# Patient Record
Sex: Female | Born: 1975
Health system: Southern US, Community
[De-identification: ages and names within clinical notes are randomized; demographics above are authoritative.]

## PROBLEM LIST (undated history)

## (undated) DIAGNOSIS — D573 Sickle-cell trait: Secondary | ICD-10-CM

## (undated) DIAGNOSIS — Z8619 Personal history of other infectious and parasitic diseases: Secondary | ICD-10-CM

## (undated) DIAGNOSIS — R51 Headache: Secondary | ICD-10-CM

## (undated) DIAGNOSIS — D649 Anemia, unspecified: Secondary | ICD-10-CM

## (undated) DIAGNOSIS — O09529 Supervision of elderly multigravida, unspecified trimester: Secondary | ICD-10-CM

## (undated) DIAGNOSIS — T4145XA Adverse effect of unspecified anesthetic, initial encounter: Secondary | ICD-10-CM

## (undated) DIAGNOSIS — T8859XA Other complications of anesthesia, initial encounter: Secondary | ICD-10-CM

## (undated) DIAGNOSIS — O009 Unspecified ectopic pregnancy without intrauterine pregnancy: Secondary | ICD-10-CM

## (undated) DIAGNOSIS — F419 Anxiety disorder, unspecified: Secondary | ICD-10-CM

## (undated) HISTORY — PX: HERNIA REPAIR: SHX51

---

## 2001-02-26 DIAGNOSIS — O009 Unspecified ectopic pregnancy without intrauterine pregnancy: Secondary | ICD-10-CM

## 2001-02-26 HISTORY — DX: Unspecified ectopic pregnancy without intrauterine pregnancy: O00.90

## 2001-11-26 ENCOUNTER — Inpatient Hospital Stay (HOSPITAL_COMMUNITY): Admission: AD | Admit: 2001-11-26 | Discharge: 2001-11-29 | Payer: Self-pay | Admitting: Gynecology

## 2001-11-26 ENCOUNTER — Encounter (INDEPENDENT_AMBULATORY_CARE_PROVIDER_SITE_OTHER): Payer: Self-pay | Admitting: Specialist

## 2007-10-10 ENCOUNTER — Inpatient Hospital Stay (HOSPITAL_COMMUNITY): Admission: RE | Admit: 2007-10-10 | Discharge: 2007-10-13 | Payer: Self-pay | Admitting: Obstetrics and Gynecology

## 2009-11-14 ENCOUNTER — Emergency Department (HOSPITAL_COMMUNITY): Admission: EM | Admit: 2009-11-14 | Discharge: 2009-11-14 | Payer: Self-pay | Admitting: Emergency Medicine

## 2009-12-21 ENCOUNTER — Emergency Department (HOSPITAL_COMMUNITY)
Admission: EM | Admit: 2009-12-21 | Discharge: 2009-12-21 | Payer: Self-pay | Source: Home / Self Care | Admitting: Specialist

## 2010-01-26 ENCOUNTER — Ambulatory Visit (HOSPITAL_COMMUNITY)
Admission: RE | Admit: 2010-01-26 | Discharge: 2010-01-26 | Payer: Self-pay | Source: Home / Self Care | Admitting: Obstetrics and Gynecology

## 2010-02-26 HISTORY — PX: DILATION AND CURETTAGE OF UTERUS: SHX78

## 2010-05-08 LAB — CBC
HCT: 30.5 % — ABNORMAL LOW (ref 36.0–46.0)
Hemoglobin: 9.6 g/dL — ABNORMAL LOW (ref 12.0–15.0)
RBC: 4.99 MIL/uL (ref 3.87–5.11)
RDW: 20 % — ABNORMAL HIGH (ref 11.5–15.5)
WBC: 6.6 10*3/uL (ref 4.0–10.5)

## 2010-07-11 NOTE — Discharge Summary (Signed)
April [name], April [name]              ACCOUNT NO.:  [id number]   MEDICAL RECORD NO.:  [id number]          PATIENT TYPE:  INP   LOCATION:  9141                          FACILITY:  [facility]   PHYSICIAN:  Gerrit Friends. Aldona Bar, M.D.   DATE OF BIRTH:  February 18, 1976   DATE OF ADMISSION:  10/10/2007  DATE OF DISCHARGE:  10/13/2007                               DISCHARGE SUMMARY   DISCHARGE DIAGNOSES:  1. Term pregnancy delivered 7-pound and 9-ounce female infant, Apgars of      8 and 9.  2. Blood type AB positive.  3. Previous cesarean section x2.  4. Omental adhesions.  5. Postoperative anemia.   PROCEDURE:  A repeat low-transverse cesarean section and lysis of  omental adhesions.   SUMMARY:  This patient, a 35 year old having had 2 previous cesarean  sections was admitted for a repeat cesarean section at term.  She was  taken to the operating room by Dr. Dareen Piano on October 10, 2007, and was  delivered the 7-pound and 9-ounce female infant with a good Apgars.  Intraoperatively, there were some lysis of adhesions involving the  omentum.  Postoperatively, the patient's vital signs remained stable.  Her postop hemoglobin was 6.6 with a white count of 8000 and a platelet  count of 118,000.  Her preop hemoglobin was 10.2 with a white count of  6800 and platelet count of 247,000.   The patient remained afebrile during her hospital course.  On the  morning of October 13, 2007, her wound was slightly moist and Steri-  Strips were holding well.  There were no obvious collections and her  uterus was firm.  Decision was made to discharge the patient as her  vital signs were stable and she was doing well, not only postoperatively  but with her breast-feeding.  Her old Steri-Strips were removed.  The  wound was cleaned and new Steri-Strips were applied, and appropriate  instructions were given to the patient regarding incisional care.  She  was also given an instruction brochure and understood all instructions   well.   DISCHARGE MEDICATIONS:  Tylox 1-2 every 4-6 hours as needed for severe  pain, Motrin 600 mg every 6 hours as needed for cramping or pain and she  will stay on her vitamins, and she will begin Feosol capsules - one  daily.  She will return to the office for followup in approximately 4  weeks' time or as needed.   CONDITION ON DISCHARGE:  Improved.      Gerrit Friends. Aldona Bar, M.D.  Electronically Signed     RMW/MEDQ  D:  10/13/2007  T:  10/13/2007  Job:  [id number]

## 2010-07-11 NOTE — Op Note (Signed)
[name], April [name]              ACCOUNT NO.:  [id number]   MEDICAL RECORD NO.:  [id number]          PATIENT TYPE:  INP   LOCATION:  9199                          [facility]ACILITY:  [facility]   PHYSICIAN:  Malva Limes, M.D.    DATE OF BIRTH:  1975-03-11   DATE OF PROCEDURE:  10/10/2007  DATE OF DISCHARGE:                               OPERATIVE REPORT   PREOPERATIVE DIAGNOSES:  1. Intrauterine pregnancy at term.  2. History of prior cesarean section x2.  3. Patient desires repeat cesarean section.   POSTOPERATIVE DIAGNOSES:  1. Intrauterine pregnancy at term.  2. History of prior cesarean section x2.  3. Patient desires repeat cesarean section.   PROCEDURES:  1. Repeat low transverse cesarean section.  2. Lysis of omental adhesion.   SURGEON:  Malva Limes, MD   ASSISTANT:  Luvenia Redden, MD   ANESTHESIA:  Spinal plus local antibiotic Ancef 1 g.   DRAINS:  Foley to bedside drainage.   ESTIMATED BLOOD LOSS:  900 mL.   COMPLICATIONS:  None.   SPECIMENS:  None.   PROCEDURE:  The patient was taken to the operative suite where spinal  anesthetic was administered.  Once an adequate level was reached, the  patient was prepped and draped in the usual fashion for this procedure.  A Pfannenstiel incision was made through the previous scar.  On entering  parietal peritoneum, it was noted that the patient had an omental  adhesion to the left lower abdominal wall.  This was taken down with the  Bovie.  The bladder flap was not taken down.  A low transverse uterine  incision was made in the midline and extended laterally with blunt  dissection.  The amniotic sac was entered with the Metzenbaums.  The  fluid was noted be clear.  The infant was delivered with the vacuum  extractor in the vertex presentation.  There was a nuchal cord x1, which  was reduced.  On delivery of the head, the oropharynx and nostrils were  bulb suctioned.  The remaining infant was then delivered.  The cord was  doubly clamped and cut, and the infant handed to the waiting NICU team.  The placenta was manually removed.  The uterus was examined and cleaned  with the wet lap.  The patient was noted to have a 7-cm anterior fibroid  in the midportion of the fundus.  The uterine incision was closed in a  single layer of 0 Monocryl suture in a running locking fashion.  Hemostasis was checked and felt to be adequate.  The parietal peritoneum  and rectus muscles were reapproximated in the midline using 0 Monocryl  suture in a running fashion.  The fascia was closed using 0 Monocryl  suture in a running fashion.  The subcuticular tissue was made  hemostatic with the Bovie.  The subcuticular space was closed with  interrupted 2-0 plain gut suture.  The skin was closed with 3-0 Rapide  in a subcuticular fashion.  Steri-Strips were then placed.  The patient  was taken to recovery room in stable condition.  Instrument and lap  counts  were correct x2.           ______________________________  Malva Limes, M.D.     MA/MEDQ  D:  10/10/2007  T:  10/11/2007  Job:  [id number]

## 2010-07-14 NOTE — Discharge Summary (Signed)
   NAME:  April [name], April [name]                        ACCOUNT NO.:  [id number]   MEDICAL RECORD NO.:  [id number]                   PATIENT TYPE:  INP   LOCATION:  9324                                 [facility]ILITY:  [facility]   PHYSICIAN:  Juan H. Lily Peer, M.D.             DATE OF BIRTH:  15-Mar-1975   DATE OF ADMISSION:  11/26/2001  DATE OF DISCHARGE:  11/29/2001                                 DISCHARGE SUMMARY   DISCHARGE DIAGNOSIS:  Left isthmic ectopic pregnancy nonruptured.   PROCEDURE:  1. Attempted diagnostic laparoscopy.  2. Laparotomy.  3. Left Lanier salpingoscopy.   HISTORY OF PRESENT ILLNESS:  The patient is a 34 year old gravida 3, para 2  currently at 7-1/[redacted] weeks gestation.  The patient presents with left lower  quadrant pain, vaginal bleeding, a quantitive beta HCG 3000 milliunits  range.  Ultrasound reveals no evidence of intrauterine pregnancy.   PAST MEDICAL HISTORY:  The patient has had two previous cesarean sections,  umbilical herniorrhaphy.  She denies allergies, alcohol consumption or  smoking.  No history of pelvic inflammatory disease.  The patient was  admitted for definitive therapy.   HOSPITAL COURSE:  The patient was admitted for attempted diagnostic  laparoscopy, laparotomy, left Lanier salpingoscopy performed by Dr.  Lily Peer under general anesthesia.  Findings revealed a left isthmic  unruptured ectopic pregnancy, rush fimbriated end, normal left ovarian,  right tubes and ovary, normal uterus.  The cul-de-sac was without evidence  of endometriosis or adhesions.  Anterior cul-de-sac clear.  Upper abdominal  adhesions postoperatively.  The patient initially had some problems with  nausea and tolerating clear liquids although she did not have vomiting.  This did spontaneously resolved.  She was able to be discharged in  satisfactory condition on her third postoperative day.   CBC, hematocrit 29.7.  Hemoglobin 9.8.  Platelets 239.   DISPOSITION:  The patient  was discharged on the appropriate pain medication  and was to follow up in the office on 12/02/01 for staple removal.     Elwyn Lade . Hancock, N.P.                Gaetano Hawthorne. Lily Peer, M.D.    MKH/MEDQ  D:  12/22/2001  T:  12/22/2001  Job:  [id number]

## 2010-07-14 NOTE — Op Note (Signed)
NAME:  April [name], April [name]                        ACCOUNT NO.:  [id number]   MEDICAL RECORD NO.:  [id number]                   PATIENT TYPE:  AMB   LOCATION:  SDC                                  [facility]   PHYSICIAN:  Juan H. Lily Peer, M.D.             DATE OF BIRTH:  02-Sep-1975   DATE OF PROCEDURE:  11/26/2001  DATE OF DISCHARGE:                                 OPERATIVE REPORT   INDICATION FOR OPERATION:  The patient is a 35 year old gravida 3, para 2,  currently 7 1/2 weeks' gestation with left lower quadrant pain, vaginal  bleeding, and a quantitative beta HCG at the 3000 mIU range with ultrasound  with no evidence of intrauterine pregnancy, suspicious left adnexal region  for a left ectopic pregnancy.   PREOPERATIVE DIAGNOSIS:  Left ectopic pregnancy.   POSTOPERATIVE DIAGNOSIS:  Left isthmic ectopic pregnancy, nonruptured.   ANESTHESIA:  General endotracheal anesthesia.   SURGEON:  Juan H. Lily Peer, M.D.   PROCEDURES PERFORMED:  1. Attempted diagnostic laparoscopy.  2. Laparotomy.  3. Left linear salpingostomy.   FINDINGS:  Left isthmic ectopic pregnancy, nonruptured.  Lush fimbriated  end, normal left ovary, normal right tube and ovary, normal uterus.  Cul-de-  sac was without evidence of endometriosis or adhesions, anterior cul-de-sac  clear, upper abdominal adhesions.   DESCRIPTION OF OPERATION:  After the patient was adequately counseled, she  was taken to the operating room where she underwent a successful general  endotracheal anesthesia.  She was placed in low lithotomy position.  The  abdomen, vagina, and perineum were prepped and draped in the usual sterile  fashion.  A Foley catheter was inserted to monitor urinary output.  A Hulka  tenaculum was introduced for manipulation during laparoscopic procedure.  After the drapes had been placed a small subumbilical incision was made  adjacent to the previous subumbilical incision site from where previous  umbilical hernia repair had been done.  The incision was carried down  through the skin, subcutaneous tissue, down to the rectus fascia.  A blunt  dissection was attempted with the use of the Kocher and tenting the fascia  and introducing the 10-mm trocar and after entrance into the peritoneal  cavity was accomplished, the sleeve was left in place.  The trocar was  removed.  The laparoscope was inserted.  It was noted at this time that  extensive anterior abdominal wall adhesions were noted and a small 5-mm  trocar was inserted above the symphysis pubis four fingerbreadths in midline  under laparoscopic guidance.  It was difficult after identifying the left  isthmic ectopic pregnancy to manipulate the area to safely proceed with the  laparoscopic removal of the ectopic pregnancy and so it was decided to abort  and proceed then the safer route, which would have been a laparotomy.  The  laparoscope was removed as was the sleeve.  The fascia was closed in a  pursestring  fashion with a GR6 needle with 0 Vicryl suture.  The skin was  reapproximated with skin clips.  The patient's leg was then straightened  after the table was then brought up.  She was no longer in the supine  position and the Hulka tenaculum was removed.  Sterile drapes were placed on  lower extremities.  She was to receive 2 grams of Cefotan.  The incision was  extended from the 5-mm trocar site in a transverse fashion.  The  subcutaneous tissue and the fascia was reincised and the fascia was incised  in a transverse fashion.  The peritoneal cavity was entered.  The Lenox Ahr retractor was placed and the peritoneal cavity was entered.  The  left side isthmic ectopic pregnancy was identified.  Pitressin 1 unit and 20  cc of saline was infiltrated to the serosa of the fallopian tube and with a  micro Bovie tip, a linear salpingostomy was made on the outside mesenteric  portion of the fallopian tube, an ectopic  pregnancy on left.  Small bleeders  were cauterized and pelvic cavity was copiously irrigated with normal saline  solution.  Interceed was placed over the salpingostomy site.  The needle  count, sponge count was correct.  The O'Connor-O'Sullivan retractor was  removed.  The visceral peritoneum was now reapproximated and the rectus  fascia was closed in a running stitch fashion.  Subcutaneous bleeders were  Bovied cauterized.  The skin was reapproximated with skin clips followed by  placing Xeroform gauze for dressing.  The patient was extubated and  transferred to recovery with stable vital signs.  Her blood loss was 100 cc,  IV fluids 2000 cc of lactated Ringers'.  Blood type was AB-positive.  Urine  output 350 cc and clear.  She was to receive 2 grams of IV Cefotan  prophylactically.                                                 Juan H. Lily Peer, M.D.    JHF/MEDQ  D:  11/26/2001  T:  11/27/2001  Job:  [id number]

## 2010-07-14 NOTE — H&P (Signed)
NAME:  April [name], April [name]                        ACCOUNT NO.:  [id number]   MEDICAL RECORD NO.:  [id number]                   PATIENT TYPE:  AMB   LOCATION:  SDC                                  [facility]   PHYSICIAN:  Juan H. Lily Peer, M.D.             DATE OF BIRTH:  [birth year]   DATE OF ADMISSION:  11/26/2001  DATE OF DISCHARGE:                                HISTORY & PHYSICAL   CHIEF COMPLAINT:  Vaginal bleeding and left lower quadrant pain.   HISTORY OF PRESENT ILLNESS:  The patient is a 35 year old gravida 3, para 2,  who was seen in the office at [facility] on November 20, 2001  due to the fact that she had a positive pregnancy test.  She had stated that  she had been bleeding for 10th day prior to that with clots and was changing  two to three times a day.  There was no vaginal bleeding noted on the day of  the examination November 20, 2001 and cramping was significant but had  resolved at the time of this visit.  She had a history of anxiety and panic  attacks in the past.  She has had two previous cesarean sections, one for  failure to progress, second for significant amount of pain and anxiety and  therefore was a repeat.  She was evaluated and it was found that she had  bacterial vaginosis, started on Cleocin q.h.s. for six nights.  Her blood  type was found to be AB positive.  She had a quantitative beta hCG.  On  November 20, 2001 it 815.  It was retested again on November 24, 2001 and  it rose to 2808.  She had an ultrasound done on November 20, 2001 and it  was assumed that perhaps she may have had a complete spontaneous AB.  There  was no evidence of intrauterine or extrauterine pregnancy.  There was a  questionable 13 x 10 mm complex thin wall cyst, probably hemorrhagic or  involuting.  She was asked to return and to follow up with a quantitative  beta hCG.  Her ultrasound today due to the fact that she was still having  left lower quadrant  pain demonstrated a uterus measuring 9.7 x 5.8 cm,  endometrial stripe 17.9 mm trilayered, no intrauterine pregnancy was seen.  The right ovary was normal.  The left ovary measured 2.9 x 1.9 x 2.5 cm with  hypoechoic focus measuring 13 x 11 mm located above the ovary and walled off  the uterus, on the left was a smooth round mass with a central cystic area  measuring 15 x 11 x 13 mm with increased vascular flow at the wall of the  mass - could not exclude an ectopic pregnancy.  She was asked to come to  [facility] and to have a quantitative beta hCG to see what the trend  was.  It  looked like it had been slightly leveling off with a value of only  3230 and not an appropriate rise.  On exam in the office as well as in the  emergency room it was evident that she had a tender left lower quadrant pain  with some rebound.  With these findings, she will be undergoing a  laparoscopy with possible left salpingostomy, possible salpingectomy,  possible left salpingo-oophorectomy this evening.   PAST MEDICAL HISTORY:  The patient with two previous cesarean sections.  She  has had an umbilical herniorrhaphy in the past.  She denies any allergies.  She denies alcohol consumption or smoking.  No history of PID reported in  the past.   LABORATORY DATA:  Quantitative beta hCG as described above.  Blood type AB  positive.  Hemoglobin and hematocrit were 10.8 and 33.7 with a platelet  count of 266,000.   PHYSICAL EXAMINATION:  VITAL SIGNS: Temperature was 100.4 when she came in,  pulse at 83 and 79, respectively; respirations 18, blood pressure initially  was 100/56 then 95/64 and her pain intensity has increased and she was  waiting for the results of the quantitative beta hCG and radiating into her  back and was having some spotting.  HEENT: Unremarkable.  NECK: Supple, trachea midline; no carotid bruits, no thyromegaly.  LUNGS: Clear to auscultation without rhonchi or wheezes.  HEART: Regular  rate and rhythm; no murmurs or gallops.  BREAST: Not done.  ABDOMEN: Soft, tender left lower quadrant with mild rebound.  PELVIC: Not done due to the fact that we had a recent ultrasound with the  above-mentioned findings and due to the fact the patient had significant  amount of discomfort.   ASSESSMENT:  The patient is a 35 year old gravida 3, para 2 with suspicion  for a left ectopic pregnancy with minimal rise of quantitative beta hCG with  values of 3230 today with no evidence of intrauterine pregnancy on  ultrasound with suspicious left adnexal mass for an ectopic pregnancy.  The  patient's blood type is AB positive.  The patient was counseled that she  will undergo a diagnostic laparoscopy to rule out an ectopic pregnancy based  on her dates, also by the way, she would be 33 weeks and 5 days.  We  discussed in detail the risks, benefits, pros and cons of the operation to  include infection although she will receive prophylactic antibiotics,  bleeding to internal organs which may require open laparotomy and to gain  access to the abdominal cavity for corrective surgery and repair,  also the  potential risk in the event that this is a corneal ectopic pregnancy that we  may not be able to proceed with the operation laparoscopically and she may  need an open laparotomy, also the potential risk for salpingostomy,  salpingectomy and left salpingo-oophorectomy and last resort as a lifesaving  measure a total abdominal hysterectomy and the patient will no longer be  able to have children, she is fully aware of this, also the risk of  hemorrhage and in the event of blood transfusion there is risk for  anaphylactic reaction, hepatitis and AIDS, also the risk for deep venous  thrombosis and pulmonary embolism was discussed with the patient.  All these  issues were discussed and as well as potential trauma from laparoscopic surgery.  Hospitalization stay can be from 1 to 2 to 3 days depending  on the  extent and what is found at the time of her surgery.  All  their questions  were answered.  Husband was present and questions were answered and we will  follow accordingly.   PLAN:  The patient scheduled for laparoscopy, possible left salpingostomy,  possible left salpingectomy and possibly left-sided oophorectomy, possible  laparotomy this evening.  Plan as per assessment above.                                               Juan H. Lily Peer, M.D.    JHF/MEDQ  D:  11/26/2001  T:  11/26/2001  Job:  [id number]

## 2010-11-24 LAB — CBC
Hemoglobin: 10.2 — ABNORMAL LOW
MCHC: 31.9
RBC: 4.72

## 2011-01-23 ENCOUNTER — Emergency Department (HOSPITAL_COMMUNITY)
Admission: EM | Admit: 2011-01-23 | Discharge: 2011-01-23 | Disposition: A | Discharge status: Home / Self Care | Payer: No Typology Code available for payment source | Attending: Emergency Medicine | Admitting: Emergency Medicine

## 2011-01-23 ENCOUNTER — Encounter: Payer: Self-pay | Admitting: Emergency Medicine

## 2011-01-23 ENCOUNTER — Emergency Department (HOSPITAL_COMMUNITY): Payer: No Typology Code available for payment source

## 2011-01-23 DIAGNOSIS — Y92410 Unspecified street and highway as the place of occurrence of the external cause: Secondary | ICD-10-CM | POA: Insufficient documentation

## 2011-01-23 DIAGNOSIS — M542 Cervicalgia: Secondary | ICD-10-CM | POA: Insufficient documentation

## 2011-01-23 DIAGNOSIS — S161XXA Strain of muscle, fascia and tendon at neck level, initial encounter: Secondary | ICD-10-CM

## 2011-01-23 DIAGNOSIS — M545 Low back pain, unspecified: Secondary | ICD-10-CM | POA: Insufficient documentation

## 2011-01-23 DIAGNOSIS — S139XXA Sprain of joints and ligaments of unspecified parts of neck, initial encounter: Secondary | ICD-10-CM | POA: Insufficient documentation

## 2011-01-23 DIAGNOSIS — S335XXA Sprain of ligaments of lumbar spine, initial encounter: Secondary | ICD-10-CM | POA: Insufficient documentation

## 2011-01-23 DIAGNOSIS — S39012A Strain of muscle, fascia and tendon of lower back, initial encounter: Secondary | ICD-10-CM

## 2011-01-23 MED ORDER — IBUPROFEN 800 MG PO TABS: 800.0000 mg | ORAL_TABLET | Freq: Three times a day (TID) | ORAL | Status: AC

## 2011-01-23 MED ORDER — HYDROCODONE-ACETAMINOPHEN 5-500 MG PO TABS: 1.0000 | ORAL_TABLET | Freq: Four times a day (QID) | ORAL | Status: AC | PRN

## 2011-01-23 MED ORDER — DIAZEPAM 5 MG PO TABS
5.0000 mg | ORAL_TABLET | Freq: Once | ORAL | Status: AC
  Administered 2011-01-23: 5 mg via ORAL
  Filled 2011-01-23: qty 1

## 2011-01-23 MED ORDER — FENTANYL CITRATE 0.05 MG/ML IJ SOLN
100.0000 ug | Freq: Once | INTRAMUSCULAR | Status: AC
  Administered 2011-01-23: 100 ug via INTRAMUSCULAR
  Filled 2011-01-23: qty 2

## 2011-01-23 MED ORDER — DIAZEPAM 5 MG PO TABS: ORAL_TABLET | ORAL | Status: AC

## 2011-01-23 MED ORDER — ONDANSETRON 8 MG PO TBDP
8.0000 mg | ORAL_TABLET | Freq: Once | ORAL | Status: AC
Start: 2011-01-23 — End: 2011-01-23
  Administered 2011-01-23: 8 mg via ORAL
  Filled 2011-01-23: qty 1

## 2011-01-23 MED ORDER — OXYCODONE-ACETAMINOPHEN 5-325 MG PO TABS: 1.0000 | ORAL_TABLET | Freq: Once | ORAL | Status: DC

## 2011-01-23 NOTE — ED Notes (Signed)
Family at bedside. 
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
## 2011-01-23 NOTE — ED Notes (Signed)
MD at bedside. 

## 2011-01-23 NOTE — ED Provider Notes (Signed)
Medical screening examination/treatment/procedure(s) were performed by non-physician practitioner and as supervising physician I was immediately available for consultation/collaboration.   Nickalous Stingley, MD 01/23/11 1616 

## 2011-01-23 NOTE — ED Notes (Signed)
C-collar removed by PA. Pt. Medicated and ready for d/c.
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
## 2012-09-04 ENCOUNTER — Other Ambulatory Visit: Payer: Self-pay | Admitting: Neurosurgery

## 2012-09-05 ENCOUNTER — Encounter (HOSPITAL_COMMUNITY): Payer: Self-pay | Admitting: *Deleted

## 2012-09-07 MED ORDER — CEFAZOLIN SODIUM-DEXTROSE 2-3 GM-% IV SOLR
2.0000 g | INTRAVENOUS | Status: AC
  Administered 2012-09-08: 2 g via INTRAVENOUS

## 2012-09-08 ENCOUNTER — Encounter (HOSPITAL_COMMUNITY): Payer: Self-pay | Admitting: *Deleted

## 2012-09-08 ENCOUNTER — Ambulatory Visit (HOSPITAL_COMMUNITY): Payer: 59

## 2012-09-08 ENCOUNTER — Observation Stay (HOSPITAL_COMMUNITY)
Admission: RE | Admit: 2012-09-08 | Discharge: 2012-09-09 | Disposition: A | Discharge status: Home / Self Care | Payer: 59 | Source: Ambulatory Visit | Attending: Neurosurgery | Admitting: Neurosurgery

## 2012-09-08 ENCOUNTER — Encounter (HOSPITAL_COMMUNITY)
Admission: RE | Disposition: A | Discharge status: Home / Self Care | Payer: Self-pay | Source: Ambulatory Visit | Attending: Neurosurgery

## 2012-09-08 ENCOUNTER — Ambulatory Visit (HOSPITAL_COMMUNITY): Payer: 59 | Admitting: *Deleted

## 2012-09-08 ENCOUNTER — Encounter (HOSPITAL_COMMUNITY): Payer: Self-pay

## 2012-09-08 DIAGNOSIS — M5020 Other cervical disc displacement, unspecified cervical region: Principal | ICD-10-CM | POA: Diagnosis present

## 2012-09-08 HISTORY — DX: Anxiety disorder, unspecified: F41.9

## 2012-09-08 HISTORY — PX: ANTERIOR CERVICAL DECOMP/DISCECTOMY FUSION: SHX1161

## 2012-09-08 HISTORY — DX: Headache: R51

## 2012-09-08 HISTORY — DX: Anemia, unspecified: D64.9

## 2012-09-08 HISTORY — DX: Adverse effect of unspecified anesthetic, initial encounter: T41.45XA

## 2012-09-08 HISTORY — DX: Other complications of anesthesia, initial encounter: T88.59XA

## 2012-09-08 HISTORY — DX: Unspecified ectopic pregnancy without intrauterine pregnancy: O00.90

## 2012-09-08 LAB — CBC
MCH: 20.4 pg — ABNORMAL LOW (ref 26.0–34.0)
MCHC: 32.9 g/dL (ref 30.0–36.0)
Platelets: 311 10*3/uL (ref 150–400)
RDW: 16.6 % — ABNORMAL HIGH (ref 11.5–15.5)

## 2012-09-08 LAB — HCG, SERUM, QUALITATIVE: Preg, Serum: NEGATIVE

## 2012-09-08 LAB — SURGICAL PCR SCREEN
MRSA, PCR: NEGATIVE
Staphylococcus aureus: POSITIVE — AB

## 2012-09-08 SURGERY — ANTERIOR CERVICAL DECOMPRESSION/DISCECTOMY FUSION 1 LEVEL
Anesthesia: General | Site: Neck | Wound class: Clean

## 2012-09-08 MED ORDER — MUPIROCIN 2 % EX OINT
TOPICAL_OINTMENT | Freq: Two times a day (BID) | CUTANEOUS | Status: DC
  Filled 2012-09-08: qty 22

## 2012-09-08 MED ORDER — HYDROMORPHONE HCL PF 1 MG/ML IJ SOLN
INTRAMUSCULAR | Status: AC
  Filled 2012-09-08: qty 1

## 2012-09-08 MED ORDER — SENNA 8.6 MG PO TABS
1.0000 | ORAL_TABLET | Freq: Two times a day (BID) | ORAL | Status: DC
Start: 2012-09-08 — End: 2012-09-09
  Administered 2012-09-08 – 2012-09-09 (×2): 8.6 mg via ORAL
  Filled 2012-09-08 (×3): qty 1

## 2012-09-08 MED ORDER — MIDAZOLAM HCL 5 MG/5ML IJ SOLN
INTRAMUSCULAR | Status: DC | PRN
  Administered 2012-09-08: 1 mg via INTRAVENOUS

## 2012-09-08 MED ORDER — PHENOL 1.4 % MT LIQD: 1.0000 | OROMUCOSAL | Status: DC | PRN

## 2012-09-08 MED ORDER — OXYCODONE-ACETAMINOPHEN 5-325 MG PO TABS
1.0000 | ORAL_TABLET | ORAL | Status: DC | PRN
  Administered 2012-09-09 (×2): 2 via ORAL
  Filled 2012-09-08 (×3): qty 2

## 2012-09-08 MED ORDER — HYDROMORPHONE HCL PF 1 MG/ML IJ SOLN
0.5000 mg | INTRAMUSCULAR | Status: DC | PRN
  Administered 2012-09-08 (×2): 1 mg via INTRAVENOUS
  Filled 2012-09-08 (×2): qty 1

## 2012-09-08 MED ORDER — OXYCODONE HCL 5 MG PO TABS
ORAL_TABLET | ORAL | Status: AC
  Filled 2012-09-08: qty 1

## 2012-09-08 MED ORDER — ACETAMINOPHEN 325 MG PO TABS: 650.0000 mg | ORAL_TABLET | ORAL | Status: DC | PRN

## 2012-09-08 MED ORDER — ARTIFICIAL TEARS OP OINT
TOPICAL_OINTMENT | OPHTHALMIC | Status: DC | PRN
  Administered 2012-09-08: 1 via OPHTHALMIC

## 2012-09-08 MED ORDER — GLYCOPYRROLATE 0.2 MG/ML IJ SOLN
INTRAMUSCULAR | Status: DC | PRN
  Administered 2012-09-08: .8 mg via INTRAVENOUS

## 2012-09-08 MED ORDER — THROMBIN 5000 UNITS EX SOLR
CUTANEOUS | Status: DC | PRN
  Administered 2012-09-08 (×2): 5000 [IU] via TOPICAL

## 2012-09-08 MED ORDER — SODIUM CHLORIDE 0.9 % IJ SOLN: 3.0000 mL | INTRAMUSCULAR | Status: DC | PRN

## 2012-09-08 MED ORDER — SODIUM CHLORIDE 0.9 % IR SOLN
Status: DC | PRN
  Administered 2012-09-08: 14:00:00

## 2012-09-08 MED ORDER — CYCLOBENZAPRINE HCL 10 MG PO TABS
10.0000 mg | ORAL_TABLET | Freq: Three times a day (TID) | ORAL | Status: DC | PRN
  Administered 2012-09-08 – 2012-09-09 (×2): 10 mg via ORAL
  Filled 2012-09-08 (×2): qty 1

## 2012-09-08 MED ORDER — FENTANYL CITRATE 0.05 MG/ML IJ SOLN
INTRAMUSCULAR | Status: DC | PRN
  Administered 2012-09-08: 100 ug via INTRAVENOUS
  Administered 2012-09-08 (×5): 50 ug via INTRAVENOUS

## 2012-09-08 MED ORDER — PROPOFOL 10 MG/ML IV BOLUS
INTRAVENOUS | Status: DC | PRN
  Administered 2012-09-08: 280 mg via INTRAVENOUS

## 2012-09-08 MED ORDER — PROMETHAZINE HCL 25 MG/ML IJ SOLN: 6.2500 mg | INTRAMUSCULAR | Status: DC | PRN

## 2012-09-08 MED ORDER — CYCLOBENZAPRINE HCL 10 MG PO TABS
ORAL_TABLET | ORAL | Status: AC
  Filled 2012-09-08: qty 1

## 2012-09-08 MED ORDER — HYDROCODONE-ACETAMINOPHEN 5-325 MG PO TABS: 1.0000 | ORAL_TABLET | ORAL | Status: DC | PRN

## 2012-09-08 MED ORDER — OXYCODONE HCL 5 MG/5ML PO SOLN: 5.0000 mg | Freq: Once | ORAL | Status: AC | PRN

## 2012-09-08 MED ORDER — BACITRACIN 50000 UNITS IM SOLR
INTRAMUSCULAR | Status: AC
  Filled 2012-09-08: qty 1

## 2012-09-08 MED ORDER — OXYCODONE HCL 5 MG PO TABS
5.0000 mg | ORAL_TABLET | Freq: Once | ORAL | Status: AC | PRN
  Administered 2012-09-08: 5 mg via ORAL

## 2012-09-08 MED ORDER — SODIUM CHLORIDE 0.9 % IJ SOLN
3.0000 mL | Freq: Two times a day (BID) | INTRAMUSCULAR | Status: DC
  Administered 2012-09-08: 3 mL via INTRAVENOUS

## 2012-09-08 MED ORDER — ROCURONIUM BROMIDE 100 MG/10ML IV SOLN
INTRAVENOUS | Status: DC | PRN
  Administered 2012-09-08: 50 mg via INTRAVENOUS
  Administered 2012-09-08: 10 mg via INTRAVENOUS

## 2012-09-08 MED ORDER — NEOSTIGMINE METHYLSULFATE 1 MG/ML IJ SOLN
INTRAMUSCULAR | Status: DC | PRN
  Administered 2012-09-08: 5 mg via INTRAVENOUS

## 2012-09-08 MED ORDER — LIDOCAINE HCL (CARDIAC) 20 MG/ML IV SOLN
INTRAVENOUS | Status: DC | PRN
  Administered 2012-09-08: 100 mg via INTRAVENOUS

## 2012-09-08 MED ORDER — MENTHOL 3 MG MT LOZG: 1.0000 | LOZENGE | OROMUCOSAL | Status: DC | PRN

## 2012-09-08 MED ORDER — LACTATED RINGERS IV SOLN
INTRAVENOUS | Status: DC | PRN
  Administered 2012-09-08 (×2): via INTRAVENOUS

## 2012-09-08 MED ORDER — PHENYLEPHRINE HCL 10 MG/ML IJ SOLN
INTRAMUSCULAR | Status: DC | PRN
  Administered 2012-09-08 (×2): 80 ug via INTRAVENOUS

## 2012-09-08 MED ORDER — SODIUM CHLORIDE 0.9 % IV SOLN
INTRAVENOUS | Status: AC
  Filled 2012-09-08: qty 500

## 2012-09-08 MED ORDER — ACETAMINOPHEN 650 MG RE SUPP: 650.0000 mg | RECTAL | Status: DC | PRN

## 2012-09-08 MED ORDER — SODIUM CHLORIDE 0.9 % IV SOLN: 250.0000 mL | INTRAVENOUS | Status: DC

## 2012-09-08 MED ORDER — 0.9 % SODIUM CHLORIDE (POUR BTL) OPTIME
TOPICAL | Status: DC | PRN
  Administered 2012-09-08: 1000 mL

## 2012-09-08 MED ORDER — ONDANSETRON HCL 4 MG/2ML IJ SOLN
4.0000 mg | INTRAMUSCULAR | Status: DC | PRN
Start: 2012-09-08 — End: 2012-09-09

## 2012-09-08 MED ORDER — HYDROMORPHONE HCL PF 1 MG/ML IJ SOLN
0.2500 mg | INTRAMUSCULAR | Status: DC | PRN
  Administered 2012-09-08 (×4): 0.5 mg via INTRAVENOUS

## 2012-09-08 MED ORDER — LACTATED RINGERS IV SOLN
INTRAVENOUS | Status: DC
  Administered 2012-09-08: 13:00:00 via INTRAVENOUS

## 2012-09-08 MED ORDER — CEFAZOLIN SODIUM 1-5 GM-% IV SOLN
1.0000 g | Freq: Three times a day (TID) | INTRAVENOUS | Status: AC
  Administered 2012-09-08 – 2012-09-09 (×2): 1 g via INTRAVENOUS
  Filled 2012-09-08 (×2): qty 50

## 2012-09-08 MED ORDER — ALUM & MAG HYDROXIDE-SIMETH 200-200-20 MG/5ML PO SUSP: 30.0000 mL | Freq: Four times a day (QID) | ORAL | Status: DC | PRN

## 2012-09-08 MED ORDER — ONDANSETRON HCL 4 MG/2ML IJ SOLN
INTRAMUSCULAR | Status: DC | PRN
  Administered 2012-09-08: 4 mg via INTRAVENOUS

## 2012-09-08 MED ORDER — HEMOSTATIC AGENTS (NO CHARGE) OPTIME
TOPICAL | Status: DC | PRN
  Administered 2012-09-08: 1 via TOPICAL

## 2012-09-08 SURGICAL SUPPLY — 55 items
BAG DECANTER FOR FLEXI CONT (MISCELLANEOUS) ×2 IMPLANT
BENZOIN TINCTURE PRP APPL 2/3 (GAUZE/BANDAGES/DRESSINGS) ×2 IMPLANT
BRUSH SCRUB EZ PLAIN DRY (MISCELLANEOUS) ×2 IMPLANT
BUR MATCHSTICK NEURO 3.0 LAGG (BURR) ×2 IMPLANT
CANISTER SUCTION 2500CC (MISCELLANEOUS) ×2 IMPLANT
CLOTH BEACON ORANGE TIMEOUT ST (SAFETY) ×2 IMPLANT
CONT SPEC 4OZ CLIKSEAL STRL BL (MISCELLANEOUS) ×2 IMPLANT
DRAPE C-ARM 42X72 X-RAY (DRAPES) ×4 IMPLANT
DRAPE LAPAROTOMY 100X72 PEDS (DRAPES) ×2 IMPLANT
DRAPE MICROSCOPE ZEISS OPMI (DRAPES) ×2 IMPLANT
DRAPE POUCH INSTRU U-SHP 10X18 (DRAPES) ×2 IMPLANT
DRILL BIT (BIT) ×2 IMPLANT
DURAPREP 6ML APPLICATOR 50/CS (WOUND CARE) ×2 IMPLANT
ELECT COATED BLADE 2.86 ST (ELECTRODE) ×2 IMPLANT
ELECT REM PT RETURN 9FT ADLT (ELECTROSURGICAL) ×2
ELECTRODE REM PT RTRN 9FT ADLT (ELECTROSURGICAL) ×1 IMPLANT
GAUZE SPONGE 4X4 16PLY XRAY LF (GAUZE/BANDAGES/DRESSINGS) IMPLANT
GLOVE BIOGEL PI IND STRL 7.5 (GLOVE) ×3 IMPLANT
GLOVE BIOGEL PI INDICATOR 7.5 (GLOVE) ×3
GLOVE ECLIPSE 8.0 STRL XLNG CF (GLOVE) ×4 IMPLANT
GLOVE ECLIPSE 8.5 STRL (GLOVE) ×4 IMPLANT
GLOVE EXAM NITRILE LRG STRL (GLOVE) IMPLANT
GLOVE EXAM NITRILE MD LF STRL (GLOVE) IMPLANT
GLOVE EXAM NITRILE XL STR (GLOVE) IMPLANT
GLOVE EXAM NITRILE XS STR PU (GLOVE) IMPLANT
GOWN BRE IMP SLV AUR LG STRL (GOWN DISPOSABLE) IMPLANT
GOWN BRE IMP SLV AUR XL STRL (GOWN DISPOSABLE) ×2 IMPLANT
GOWN STRL REIN 2XL LVL4 (GOWN DISPOSABLE) ×4 IMPLANT
HEAD HALTER (SOFTGOODS) ×2 IMPLANT
KIT BASIN OR (CUSTOM PROCEDURE TRAY) ×2 IMPLANT
KIT ROOM TURNOVER OR (KITS) ×2 IMPLANT
NEEDLE SPNL 20GX3.5 QUINCKE YW (NEEDLE) ×2 IMPLANT
NS IRRIG 1000ML POUR BTL (IV SOLUTION) ×2 IMPLANT
PACK LAMINECTOMY NEURO (CUSTOM PROCEDURE TRAY) ×2 IMPLANT
PAD ARMBOARD 7.5X6 YLW CONV (MISCELLANEOUS) ×6 IMPLANT
PEEK CAGE 7X14X11 (Cage) ×2 IMPLANT
PLATE ELITE VISION 25MM (Plate) ×2 IMPLANT
PUTTY DBX 1CC (Putty) ×2 IMPLANT
PUTTY DBX 1CC DEPUY (Putty) ×1 IMPLANT
RUBBERBAND STERILE (MISCELLANEOUS) ×4 IMPLANT
SCREW ST 13X4XST VA NS SPNE (Screw) ×4 IMPLANT
SCREW ST VAR 4 ATL (Screw) ×4 IMPLANT
SPONGE GAUZE 4X4 12PLY (GAUZE/BANDAGES/DRESSINGS) ×2 IMPLANT
SPONGE INTESTINAL PEANUT (DISPOSABLE) ×2 IMPLANT
SPONGE SURGIFOAM ABS GEL SZ50 (HEMOSTASIS) ×2 IMPLANT
STRIP CLOSURE SKIN 1/2X4 (GAUZE/BANDAGES/DRESSINGS) ×2 IMPLANT
SUT PDS AB 5-0 P3 18 (SUTURE) ×2 IMPLANT
SUT VIC AB 3-0 SH 8-18 (SUTURE) ×2 IMPLANT
SYR 20ML ECCENTRIC (SYRINGE) ×2 IMPLANT
TAPE CLOTH 4X10 WHT NS (GAUZE/BANDAGES/DRESSINGS) ×2 IMPLANT
TAPE CLOTH SURG 4X10 WHT LF (GAUZE/BANDAGES/DRESSINGS) ×2 IMPLANT
TOWEL OR 17X24 6PK STRL BLUE (TOWEL DISPOSABLE) ×2 IMPLANT
TOWEL OR 17X26 10 PK STRL BLUE (TOWEL DISPOSABLE) ×2 IMPLANT
TRAP SPECIMEN MUCOUS 40CC (MISCELLANEOUS) ×2 IMPLANT
WATER STERILE IRR 1000ML POUR (IV SOLUTION) ×2 IMPLANT

## 2012-09-08 NOTE — Brief Op Note (Signed)
09/08/2012  3:16 PM  PATIENT:  April [name]  37 y.o. female  PRE-OPERATIVE DIAGNOSIS:  Cervical herniated disc, cervical radiculopathy, neck pain  POST-OPERATIVE DIAGNOSIS:  Cervical herniated disc, cervical radiculopathy, neck pain  PROCEDURE:  Procedure(s) with comments: ANTERIOR CERVICAL DECOMPRESSION/DISCECTOMY FUSION 1 LEVEL (N/A) - Cervical six-seven anterior cervical decompression with fusion interbody prothesis plating and bonegraft  SURGEON:  Surgeon(s) and Role:    * Temple Pacini, MD - Primary  PHYSICIAN ASSISTANT:   ASSISTANTS:    ANESTHESIA:   general  EBL:  Total I/O In: 1000 [I.V.:1000] Out: -   BLOOD ADMINISTERED:none  DRAINS: none   LOCAL MEDICATIONS USED:  NONE  SPECIMEN:  No Specimen  DISPOSITION OF SPECIMEN:  N/A  COUNTS:  YES  TOURNIQUET:  * No tourniquets in log *  DICTATION: .Dragon Dictation  PLAN OF CARE: Admit to inpatient   PATIENT DISPOSITION:  PACU - hemodynamically stable.   Delay start of Pharmacological VTE agent (>24hrs) due to surgical blood loss or risk of bleeding: yes

## 2012-09-08 NOTE — Op Note (Signed)
Date of procedure: 09/08/2012  Date of dictation: Same  Service: Neurosurgery  Preoperative diagnosis: Right C6-7 herniated pulposus with radiculopathy  Postoperative diagnosis: Same  Procedure Name: C6-7 anterior cervical discectomy with interbody fusion utilizing peak cage and local autograft coupled with anterior plate fixation.  Surgeon:Bernadette Gores A.Nain Rudd, M.D.  Asst. Surgeon: None  Anesthesia: General  Indication: 37 year old female with severe neck and right upper extremity a pain paresthesias and weakness. Workup demonstrates evidence of a very large right-sided C6-7 disc herniation with marked compression of the C7 nerve root. Patient presents now for anterior cervical decompression and fusion.  Operative note: After induction of anesthesia, patient is positioned supine with her Extended and held in place with halter traction. Patient's anterior cervical region was prepped and draped sterilely. 10 blade was used to make a linear skin incision overlying the C6-7 interspace. This is carried down sharply to the platysma. The platysma was then divided vertically. Dissection proceeded along the medial border of the sternocleidomastoid muscle and carotid sheath. Trachea and esophagus are mobilized and retracted towards the left. Prevertebral fascia stripped off the anterior spinal column. Longus colli muscles elevated bilaterally/Carter. Deep self-retaining traction placed intraoperative fluoroscopy is used and the C6-7 level was confirmed. Disc space was then incised a 15 blade in a rectangular fashion. Y. disc space cleanout was then achieved using pituitary rongeurs forward and backward L. Carlin curettes Kerrison rongeurs the high-speed drill. All bone shavings were saved for later fusion. Microscope and brought into the field and used throughout the remainder of the discectomy. Remaining aspects of annulus and osteophytes removed using a high-speed drill down to the level of the posterior  longitudinal ligament. Posterior longitudinal was then elevated and resected in piecemeal fashion using Kerrison rongeurs. Underlying thecal sac was identified. Wide central decompression then performed by undercutting the bodies of C6 and C7. Decompression then proceeded H. neural foramen. Wide anterior foraminotomies were then performed along the course exiting C7 nerve roots bilaterally. At this point a very thorough decompression achieved. All elements the disc herniation off to the right side at C6-7 were completely removed. Wounds that area with and bike solution. A 7 mm Medtronic peek cage was then packed with morselized allograft and then gently impacted into the disc space at C6-7. This is recessed roughly 1 mm from the anterior cortical margin and C6. A 25 mm Atlantis anterior cervical plate was then placed over the C6 and C7 levels. This is an attachment or fluoroscopic guidance and 13 mm variable-angle screws 2 each at both levels. All 4 screws given a final tightening down to be solidly within bone. Locking screws engaged both levels. Final images revealed position bone graft and hardware at the proper upper level with normal alignment of the spine. Wounds an area and with and bike solution. Hemostasis was achieved with bipolar chart for was and close in layers with Vicryl sutures. Steri-Strips and sterile dressing were applied. There were no apparent complications. Patient tolerated the procedure well and she returns to the recovery room postop.

## 2012-09-08 NOTE — Transfer of Care (Signed)
Immediate Anesthesia Transfer of Care Note  Patient: RYIAH BELLISSIMO  Procedure(s) Performed: Procedure(s) with comments: ANTERIOR CERVICAL DECOMPRESSION/DISCECTOMY FUSION 1 LEVEL (N/A) - Cervical six-seven anterior cervical decompression with fusion interbody prothesis plating and bonegraft  Patient Location: PACU  Anesthesia Type:General  Level of Consciousness: oriented, sedated, patient cooperative and responds to stimulation  Airway & Oxygen Therapy: Patient Spontanous Breathing and Patient connected to nasal cannula oxygen  Post-op Assessment: Report given to PACU RN, Post -op Vital signs reviewed and stable, Patient moving all extremities and Patient moving all extremities X 4  Post vital signs: Reviewed and stable  Complications: No apparent anesthesia complications

## 2012-09-08 NOTE — Anesthesia Procedure Notes (Signed)
Procedure Name: Intubation Date/Time: 09/08/2012 1:45 PM Performed by: Gayla Medicus Pre-anesthesia Checklist: Patient identified, Timeout performed, Emergency Drugs available, Suction available and Patient being monitored Patient Re-evaluated:Patient Re-evaluated prior to inductionOxygen Delivery Method: Circle system utilized Preoxygenation: Pre-oxygenation with 100% oxygen Intubation Type: IV induction Ventilation: Oral airway inserted - appropriate to patient size and Mask ventilation without difficulty Laryngoscope Size: Mac and 3 Grade View: Grade I Tube type: Oral Tube size: 7.5 mm Number of attempts: 1 Airway Equipment and Method: Video-laryngoscopy and Stylet Placement Confirmation: ETT inserted through vocal cords under direct vision,  positive ETCO2 and breath sounds checked- equal and bilateral Secured at: 22 cm Tube secured with: Tape Dental Injury: Teeth and Oropharynx as per pre-operative assessment  Difficulty Due To: Difficulty was anticipated

## 2012-09-08 NOTE — Progress Notes (Signed)
Orthopedic Tech Progress Note Patient Details:  April [name] 06-28-1975 [id number]  Ortho Devices Type of Ortho Device: Soft collar Ortho Device/Splint Location: neck Ortho Device/Splint Interventions: Ordered As ordered by Dr. Nelwyn Salisbury, [name] 09/08/2012, 1:28 PM

## 2012-09-08 NOTE — Anesthesia Preprocedure Evaluation (Addendum)
Anesthesia Evaluation  Patient identified by MRN, date of birth, ID band Patient awake    Reviewed: Allergy & Precautions, H&P , NPO status , Patient's Chart, lab work & pertinent test results  History of Anesthesia Complications (+) PROLONGED EMERGENCE  Airway Mallampati: II TM Distance: <3 FB Neck ROM: Limited    Dental  (+) Teeth Intact and Dental Advisory Given   Pulmonary neg pulmonary ROS,  breath sounds clear to auscultation        Cardiovascular negative cardio ROS  Rhythm:Regular Rate:Normal     Neuro/Psych  Headaches, Anxiety    GI/Hepatic Neg liver ROS, GERD-  ,  Endo/Other  negative endocrine ROSMorbid obesity  Renal/GU negative Renal ROS     Musculoskeletal   Abdominal (+) + obese,   Peds  Hematology   Anesthesia Other Findings   Reproductive/Obstetrics                         Anesthesia Physical Anesthesia Plan  ASA: II  Anesthesia Plan: General   Post-op Pain Management:    Induction: Intravenous  Airway Management Planned: Oral ETT  Additional Equipment:   Intra-op Plan:   Post-operative Plan: Extubation in OR  Informed Consent: I have reviewed the patients History and Physical, chart, labs and discussed the procedure including the risks, benefits and alternatives for the proposed anesthesia with the patient or authorized representative who has indicated his/her understanding and acceptance.   Dental advisory given  Plan Discussed with: Surgeon and CRNA  Anesthesia Plan Comments:        Anesthesia Quick Evaluation

## 2012-09-08 NOTE — Preoperative (Signed)
Beta Blockers   Reason not to administer Beta Blockers:Not Applicable 

## 2012-09-08 NOTE — Anesthesia Postprocedure Evaluation (Signed)
  Anesthesia Post-op Note  Patient: April [name]  Procedure(s) Performed: Procedure(s) with comments: ANTERIOR CERVICAL DECOMPRESSION/DISCECTOMY FUSION 1 LEVEL (N/A) - Cervical six-seven anterior cervical decompression with fusion interbody prothesis plating and bonegraft  Patient Location: PACU  Anesthesia Type:General  Level of Consciousness: awake  Airway and Oxygen Therapy: Patient Spontanous Breathing  Post-op Pain: mild  Post-op Assessment: Post-op Vital signs reviewed, Patient's Cardiovascular Status Stable, Respiratory Function Stable, Patent Airway, No signs of Nausea or vomiting and Pain level controlled  Post-op Vital Signs: stable  Complications: No apparent anesthesia complications

## 2012-09-08 NOTE — H&P (Signed)
April [name] is an 37 y.o. female.   Chief Complaint: Neck and right arm pain. HPI: 37 year old female with severe neck and right arm pain. Workup demonstrates evidence of very large right-sided C6-7 disc herniation with marked compression of the C7 nerve root. Patient's failed conservative management and presents now for anterior cervical decompression infusion in hopes of improving her symptoms.  Past Medical History  Diagnosis Date  . Headache(784.0)     migraines   . Anemia     Sickle cell trait, takes iron supplement  . Ectopic pregnancy 2003  . Complication of anesthesia     has a hard time waking up  . Anxiety     PAtient has bad anxiety    Past Surgical History  Procedure Laterality Date  . Cesarean section    . Hernia repair    . Dilation and curettage of uterus  2012    History reviewed. No pertinent family history. Social History:  reports that she has never smoked. She does not have any smokeless tobacco history on file. She reports that she does not drink alcohol or use illicit drugs.  Allergies:  Allergies  Allergen Reactions  . Other Itching and Rash    Some med used in epidurals    Medications Prior to Admission  Medication Sig Dispense Refill  . diazepam (VALIUM) 5 MG tablet Take 5 mg by mouth at bedtime as needed for sleep.      . ferrous sulfate 325 (65 FE) MG tablet Take 325 mg by mouth daily with breakfast.      . oxyCODONE-acetaminophen (PERCOCET/ROXICET) 5-325 MG per tablet Take 1 tablet by mouth every 4 (four) hours as needed for pain.      . predniSONE (DELTASONE) 20 MG tablet Take 40 mg by mouth daily.        Results for orders placed during the hospital encounter of 09/08/12 (from the past 48 hour(s))  CBC     Status: Abnormal   Collection Time    09/08/12 12:10 PM      Result Value Range   WBC 12.1 (*) 4.0 - 10.5 K/uL   RBC 5.25 (*) 3.87 - 5.11 MIL/uL   Hemoglobin 10.7 (*) 12.0 - 15.0 g/dL   HCT 40.9 (*) 81.1 - 91.4 %   MCV 61.9 (*)  78.0 - 100.0 fL   MCH 20.4 (*) 26.0 - 34.0 pg   MCHC 32.9  30.0 - 36.0 g/dL   RDW 78.2 (*) 95.6 - 21.3 %   Platelets 311  150 - 400 K/uL  HCG, SERUM, QUALITATIVE     Status: None   Collection Time    09/08/12 12:10 PM      Result Value Range   Preg, Serum NEGATIVE  NEGATIVE   Comment:            THE SENSITIVITY OF THIS     METHODOLOGY IS >10 mIU/mL.   No results found.  Review of Systems  Constitutional: Negative.   HENT: Negative.   Eyes: Negative.   Respiratory: Negative.   Cardiovascular: Negative.   Gastrointestinal: Negative.   Genitourinary: Negative.   Musculoskeletal: Negative.   Skin: Negative.   Neurological: Negative.   Endo/Heme/Allergies: Negative.   Psychiatric/Behavioral: Negative.     Blood pressure 128/68, pulse 54, temperature 97.7 F (36.5 C), temperature source Oral, resp. rate 18, height 5\' 1"  (1.549 m), weight 103.959 kg (229 lb 3 oz), last menstrual period 09/06/2012, SpO2 100.00%. Physical Exam  Constitutional: She is oriented  to person, place, and time. She appears well-developed and well-nourished. She appears distressed.  HENT:  Head: Normocephalic and atraumatic.  Right Ear: External ear normal.  Left Ear: External ear normal.  Mouth/Throat: Oropharynx is clear and moist. No oropharyngeal exudate.  Eyes: Conjunctivae and EOM are normal. Pupils are equal, round, and reactive to light. Right eye exhibits no discharge. Left eye exhibits no discharge.  Neck: Normal range of motion. Neck supple. No tracheal deviation present. No thyromegaly present.  Cardiovascular: Normal rate, regular rhythm, normal heart sounds and intact distal pulses.   No murmur heard. Respiratory: Effort normal and breath sounds normal. No stridor. No respiratory distress. She has no wheezes.  GI: Soft. Bowel sounds are normal. She exhibits no distension. There is no tenderness.  Musculoskeletal: Normal range of motion. She exhibits no edema and no tenderness.   Neurological: She is alert and oriented to person, place, and time. She has normal reflexes. She displays normal reflexes. No cranial nerve deficit. She exhibits normal muscle tone. Coordination normal.  Right triceps 3/5. Right  and grips 4 minus over 5. Right finger extensors 3/5. Right C7 dermatomal sensory loss.  Skin: Skin is warm and dry. No rash noted. She is not diaphoretic. No erythema. No pallor.  Psychiatric: She has a normal mood and affect. Her behavior is normal. Thought content normal.     Assessment/Plan Right C6-7 herniated nuclear pulposus with radiculopathy. Plan C6-7 anterior cervical discectomy with fusion utilizing interbody peek cage local autograft and anterior plate instrumentation. Risks and benefits have been explained. Patient wishes to proceed.  Devan Babino A 09/08/2012, 12:57 PM

## 2012-09-09 MED ORDER — OXYCODONE-ACETAMINOPHEN 5-325 MG PO TABS: 1.0000 | ORAL_TABLET | ORAL | Status: DC | PRN

## 2012-09-09 MED ORDER — MUPIROCIN 2 % EX OINT
TOPICAL_OINTMENT | Freq: Two times a day (BID) | CUTANEOUS | Status: DC
  Administered 2012-09-09 (×2): via NASAL

## 2012-09-09 MED ORDER — DIAZEPAM 5 MG PO TABS: 5.0000 mg | ORAL_TABLET | Freq: Four times a day (QID) | ORAL | Status: DC | PRN

## 2012-09-09 NOTE — Discharge Summary (Signed)
Physician Discharge Summary  Patient ID: ETERNITY DEXTER MRN: [id number] DOB/AGE: 01-May-1975 37 y.o.  Admit date: 09/08/2012 Discharge date: 09/09/2012  Admission Diagnoses:  Discharge Diagnoses:  Principal Problem:   HNP (herniated nucleus pulposus), cervical   Discharged Condition: good  Hospital Course: Patient admitted to the hospital where she underwent uncomplicated C6-7 anterior cervical discectomy and fusion. Postoperatively she is doing well. Neck and upper extremity pain improved. Ready for discharge home.  Consults:   Significant Diagnostic Studies:   Treatments:   Discharge Exam: Blood pressure 101/61, pulse 99, temperature 99.3 F (37.4 C), temperature source Oral, resp. rate 16, height 5\' 1"  (1.549 m), weight 103.959 kg (229 lb 3 oz), last menstrual period 09/06/2012, SpO2 93.00%. Awake and alert. Oriented and appropriate. Cranial nerve function intact. Motor and sensory function of her extremities normal. Wound clean and dry. Chest abdomen benign.  Disposition: 01-Home or Self Care     Medication List    ASK your doctor about these medications       diazepam 5 MG tablet  Commonly known as:  VALIUM  Take 5 mg by mouth at bedtime as needed for sleep.     ferrous sulfate 325 (65 FE) MG tablet  Take 325 mg by mouth daily with breakfast.     oxyCODONE-acetaminophen 5-325 MG per tablet  Commonly known as:  PERCOCET/ROXICET  Take 1 tablet by mouth every 4 (four) hours as needed for pain.     predniSONE 20 MG tablet  Commonly known as:  DELTASONE  Take 40 mg by mouth daily.           Follow-up Information   Follow up with Hamad Whyte A, MD. Call in 1 week. (ext [phone])    Contact information:   [address] [place] [address] [phone]       Signed: Julio Sicks A 09/09/2012, 11:34 AM

## 2012-09-09 NOTE — Progress Notes (Signed)
Pt and husband given D/C instructions with Rx's, verbal understanding given. Pt D/C'd home via wheelchair @ 1455 per MD order. Rema Fendt, RN

## 2012-09-09 NOTE — Plan of Care (Signed)
Problem: Consults Goal: Diagnosis - Spinal Surgery Outcome: Completed/Met Date Met:  09/09/12 Cervical Spine Fusion

## 2012-09-11 ENCOUNTER — Encounter (HOSPITAL_COMMUNITY): Payer: Self-pay | Admitting: Neurosurgery
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
## 2014-07-07 IMAGING — RF DG C-ARM 61-120 MIN
1 series · 1 of 1 positions shown · non-contrast
Comparison: 01/23/2011

CLINICAL DATA: Cervical fusion

DG C-ARM 1-60 MIN,DG CERVICAL SPINE - 1 VIEW

[Series 1: run · 1 of 1 slices shown]
[im 1/1]
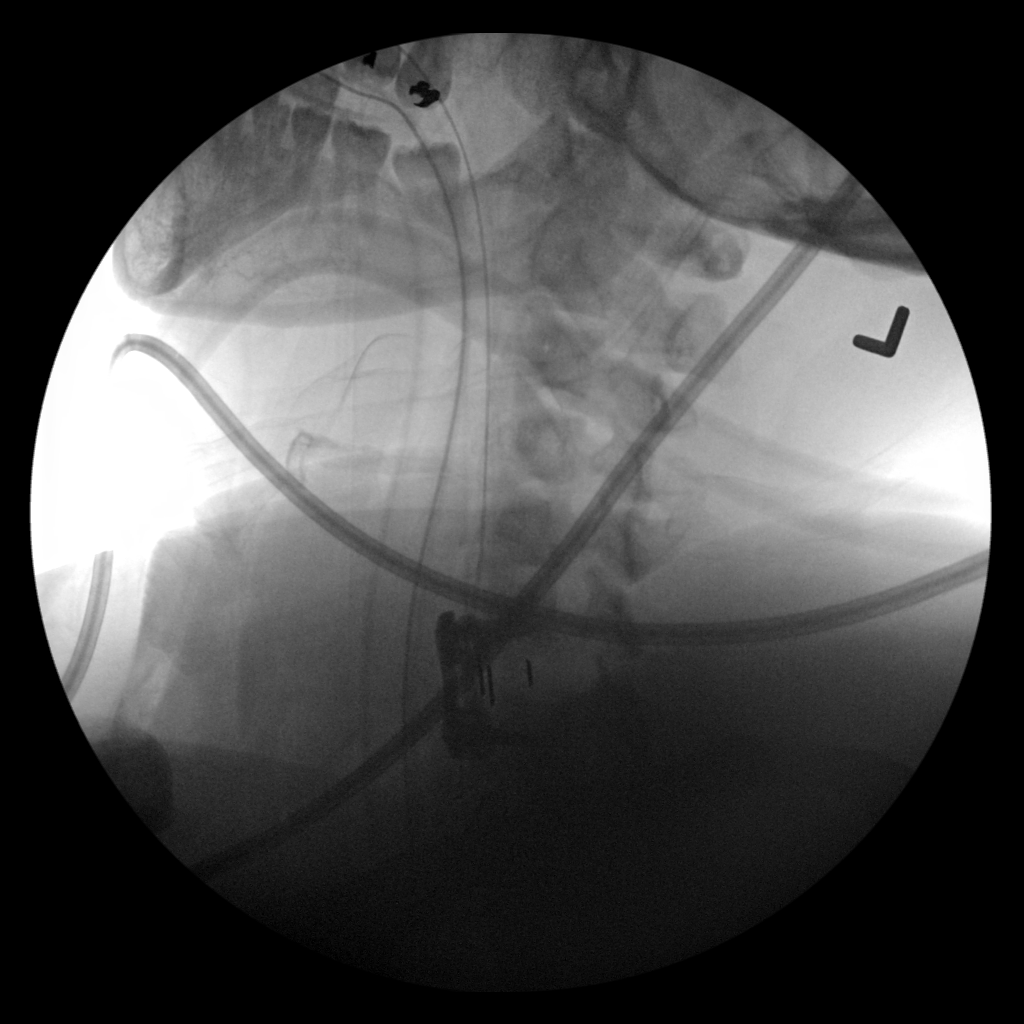

[1 of 1 positions shown; findings below may reference images not displayed]

FINDINGS: This single intraoperative spot fluoroscopic radiograph
shows changes of instrumented ACDF C6-7, surgical hardware
partially obscured by overlying external hardware.
IMPRESSION: ACDF C6-7

## 2015-03-16 ENCOUNTER — Other Ambulatory Visit: Payer: Self-pay | Admitting: Obstetrics and Gynecology

## 2015-03-16 LAB — OB RESULTS CONSOLE HIV ANTIBODY (ROUTINE TESTING): HIV: NONREACTIVE

## 2015-03-16 LAB — OB RESULTS CONSOLE GC/CHLAMYDIA
Chlamydia: NEGATIVE
GC PROBE AMP, GENITAL: NEGATIVE

## 2015-03-16 LAB — OB RESULTS CONSOLE ANTIBODY SCREEN: ANTIBODY SCREEN: NEGATIVE

## 2015-03-16 LAB — OB RESULTS CONSOLE RPR: RPR: NONREACTIVE

## 2015-03-16 LAB — OB RESULTS CONSOLE ABO/RH: RH Type: POSITIVE

## 2015-03-16 LAB — OB RESULTS CONSOLE HEPATITIS B SURFACE ANTIGEN: Hepatitis B Surface Ag: NEGATIVE

## 2015-03-16 LAB — OB RESULTS CONSOLE RUBELLA ANTIBODY, IGM: Rubella: IMMUNE

## 2015-03-17 LAB — CYTOLOGY - PAP

## 2015-04-12 MED FILL — VALACYCLOVIR HCL 500 MG TAB: 500 | 15 days supply | Qty: 30 | Fill #0

## 2015-04-12 MED FILL — FOCALGIN CA COMBO PACK: 35-1 & 300 | 30 days supply | Qty: 60 | Fill #0

## 2015-07-06 MED FILL — NITROFURANTOIN MONO-MCR 100: 100 | 7 days supply | Qty: 14 | Fill #0

## 2015-08-15 MED FILL — ZOLPIDEM TARTRATE 5 MG TAB: 5 | 30 days supply | Qty: 30 | Fill #0

## 2015-08-19 MED FILL — CYCLOBENZAPRINE 10 MG TAB: 10 | 30 days supply | Qty: 30 | Fill #0

## 2015-09-06 MED FILL — FERROUS SULFATE 325 MG TAB: 325 (65 FE) | 50 days supply | Qty: 100 | Fill #0

## 2015-09-24 ENCOUNTER — Encounter (HOSPITAL_COMMUNITY): Payer: Self-pay | Admitting: *Deleted

## 2015-09-24 ENCOUNTER — Inpatient Hospital Stay (HOSPITAL_COMMUNITY)
Admission: AD | Admit: 2015-09-24 | Discharge: 2015-09-24 | Disposition: A | Discharge status: Home / Self Care | Payer: 59 | Source: Ambulatory Visit | Attending: Obstetrics & Gynecology | Admitting: Obstetrics & Gynecology

## 2015-09-24 DIAGNOSIS — Z3A33 33 weeks gestation of pregnancy: Secondary | ICD-10-CM | POA: Insufficient documentation

## 2015-09-24 DIAGNOSIS — O9989 Other specified diseases and conditions complicating pregnancy, childbirth and the puerperium: Secondary | ICD-10-CM | POA: Diagnosis not present

## 2015-09-24 DIAGNOSIS — R109 Unspecified abdominal pain: Secondary | ICD-10-CM | POA: Insufficient documentation

## 2015-09-24 DIAGNOSIS — O26893 Other specified pregnancy related conditions, third trimester: Secondary | ICD-10-CM | POA: Insufficient documentation

## 2015-09-24 DIAGNOSIS — O26899 Other specified pregnancy related conditions, unspecified trimester: Secondary | ICD-10-CM

## 2015-09-24 LAB — URINALYSIS, ROUTINE W REFLEX MICROSCOPIC
Bilirubin Urine: NEGATIVE
GLUCOSE, UA: NEGATIVE mg/dL
Hgb urine dipstick: NEGATIVE
Ketones, ur: 15 mg/dL — AB
LEUKOCYTES UA: NEGATIVE
Nitrite: NEGATIVE
PH: 6 (ref 5.0–8.0)
Protein, ur: NEGATIVE mg/dL
SPECIFIC GRAVITY, URINE: 1.015 (ref 1.005–1.030)

## 2015-09-24 MED ORDER — BUTALBITAL-APAP-CAFFEINE 50-325-40 MG PO TABS: 1.0000 | ORAL_TABLET | Freq: Four times a day (QID) | ORAL | 0 refills | Status: DC | PRN

## 2015-09-24 MED ORDER — BUTALBITAL-APAP-CAFFEINE 50-325-40 MG PO TABS
2.0000 | ORAL_TABLET | Freq: Once | ORAL | Status: AC
  Administered 2015-09-24: 2 via ORAL
  Filled 2015-09-24: qty 2

## 2015-09-24 NOTE — MAU Provider Note (Signed)
History   [id number] @ 33.5 wks I with c/o left sided lower quad pain that started this morning. Pain is sharp shooting in nature and is in association to fetal movement. Denies ROM or vag bleeding.   CSN: [id number]  Arrival date & time 09/24/15  1712   First Provider Initiated Contact with Patient 09/24/15 1742      Chief Complaint  Patient presents with  . Back Pain  . Abdominal Pain    HPI  Past Medical History:  Diagnosis Date  . Anemia    Sickle cell trait, takes iron supplement  . Anxiety    PAtient has bad anxiety  . Complication of anesthesia    has a hard time waking up  . Ectopic pregnancy 2003  . Headache(784.0)    migraines     Past Surgical History:  Procedure Laterality Date  . ANTERIOR CERVICAL DECOMP/DISCECTOMY FUSION N/A 09/08/2012   Procedure: ANTERIOR CERVICAL DECOMPRESSION/DISCECTOMY FUSION 1 LEVEL;  Surgeon: Temple Pacini, MD;  Location: [facility];  Service: Neurosurgery;  Laterality: N/A;  Cervical six-seven anterior cervical decompression with fusion interbody prothesis plating and bonegraft  . CESAREAN SECTION    . DILATION AND CURETTAGE OF UTERUS  2012  . HERNIA REPAIR      History reviewed. No pertinent family history.  Social History  Substance Use Topics  . Smoking status: Never Smoker  . Smokeless tobacco: Never Used  . Alcohol use No    OB History    Gravida Para Term Preterm AB Living   4 3 3          SAB TAB Ectopic Multiple Live Births           3      Review of Systems  Constitutional: Negative.   HENT: Negative.   Eyes: Negative.   Respiratory: Negative.   Cardiovascular: Negative.   Gastrointestinal: Positive for abdominal pain.  Endocrine: Negative.   Genitourinary: Negative.   Musculoskeletal: Negative.   Skin: Negative.   Allergic/Immunologic: Negative.   Neurological: Negative.   Hematological: Negative.   Psychiatric/Behavioral: Negative.     Allergies  Other  Home Medications    BP 109/68 (BP  Location: Left Arm)   Pulse 94   Temp 97.5 F (36.4 C) (Oral)   Resp 18   LMP 01/31/2015   Physical Exam  Constitutional: She is oriented to person, place, and time. She appears well-developed and well-nourished.  HENT:  Head: Normocephalic.  Eyes: Pupils are equal, round, and reactive to light.  Neck: Normal range of motion.  Cardiovascular: Normal rate, regular rhythm, normal heart sounds and intact distal pulses.   Pulmonary/Chest: Effort normal and breath sounds normal.  Abdominal: Soft. Bowel sounds are normal.  Genitourinary: Vagina normal and uterus normal.  Musculoskeletal: Normal range of motion.  Neurological: She is alert and oriented to person, place, and time. She has normal reflexes.  Skin: Skin is warm and dry.  Psychiatric: She has a normal mood and affect. Her behavior is normal. Judgment and thought content normal.    MAU Course  Procedures (including critical care time)  Labs Reviewed  URINALYSIS, ROUTINE W REFLEX MICROSCOPIC (NOT AT [facility])   No results found.   No diagnosis found.    MDM  SVE firm/cl/th/high. FHR pattern reassurring, no uc's. POC discussed with Dr. Mora Appl pt to be d'ed home

## 2015-09-24 NOTE — MAU Note (Signed)
Pt states she is having pain in her back and lower abdomen on the left side.  Pt states it is like a sharp pain.  Pt states she is feeling the baby move.  Pt denies vaginal bleeding or leaking of fluid.

## 2015-09-24 NOTE — Discharge Instructions (Signed)

## 2015-10-03 ENCOUNTER — Other Ambulatory Visit: Payer: Self-pay | Admitting: Obstetrics and Gynecology

## 2015-10-03 LAB — OB RESULTS CONSOLE GBS: STREP GROUP B AG: NEGATIVE

## 2015-10-06 ENCOUNTER — Other Ambulatory Visit: Payer: Self-pay | Admitting: Obstetrics and Gynecology

## 2015-10-21 ENCOUNTER — Encounter (HOSPITAL_COMMUNITY): Payer: Self-pay

## 2015-10-28 NOTE — Patient Instructions (Signed)
20 Norva KarvonenLeslie N Fitzwater  10/28/2015   Your procedure is scheduled on:  11/02/2015  [facility] at 0600 AM.  Pick up the phone at the desk and dial [phone].   Call this number if you have problems the morning of surgery: [phone]   Remember:   Do not eat food:After Midnight.  Do not drink clear liquids: After Midnight.  Take these medicines the morning of surgery with A SIP OF WATER: none   Do not wear jewelry, make-up or nail polish.  Do not wear lotions, powders, or perfumes. Do not wear deodorant.  Do not shave 48 hours prior to surgery.  Do not bring valuables to the hospital.  [facility] is not   responsible for any belongings or valuables brought to the hospital.  Contacts, dentures or bridgework may not be worn into surgery.  Leave suitcase in the car. After surgery it may be brought to your room.  For patients admitted to the hospital, checkout time is 11:00 AM the day of              discharge.   Patients discharged the day of surgery will not be allowed to drive             home.  Name and phone number of your driver: na  Special Instructions:   N/A   Please read over the following fact sheets that you were given:   Surgical Site Infection Prevention

## 2015-11-01 ENCOUNTER — Encounter (HOSPITAL_COMMUNITY)
Admission: RE | Admit: 2015-11-01 | Discharge: 2015-11-01 | Disposition: A | Discharge status: Home / Self Care | Payer: 59 | Source: Ambulatory Visit | Attending: Obstetrics and Gynecology | Admitting: Obstetrics and Gynecology

## 2015-11-01 HISTORY — DX: Personal history of other infectious and parasitic diseases: Z86.19

## 2015-11-01 HISTORY — DX: Supervision of elderly multigravida, unspecified trimester: O09.529

## 2015-11-01 HISTORY — DX: Sickle-cell trait: D57.3

## 2015-11-01 LAB — CBC
HCT: 28.4 % — ABNORMAL LOW (ref 36.0–46.0)
HEMOGLOBIN: 9.3 g/dL — AB (ref 12.0–15.0)
MCH: 20.4 pg — ABNORMAL LOW (ref 26.0–34.0)
MCHC: 32.7 g/dL (ref 30.0–36.0)
MCV: 62.3 fL — ABNORMAL LOW (ref 78.0–100.0)
PLATELETS: 197 10*3/uL (ref 150–400)
RBC: 4.56 MIL/uL (ref 3.87–5.11)
RDW: 18 % — ABNORMAL HIGH (ref 11.5–15.5)
WBC: 5.6 10*3/uL (ref 4.0–10.5)

## 2015-11-01 LAB — PREPARE RBC (CROSSMATCH)

## 2015-11-01 LAB — ABO/RH: ABO/RH(D): AB POS

## 2015-11-01 MED ORDER — CEFAZOLIN SODIUM-DEXTROSE 2-4 GM/100ML-% IV SOLN
2.0000 g | INTRAVENOUS | Status: AC
  Administered 2015-11-02: 2 g via INTRAVENOUS

## 2015-11-01 MED ORDER — CEFAZOLIN SODIUM-DEXTROSE 2-4 GM/100ML-% IV SOLN: 2.0000 g | INTRAVENOUS | Status: DC

## 2015-11-01 NOTE — Anesthesia Preprocedure Evaluation (Signed)
Anesthesia Evaluation  Patient identified by MRN, date of birth, ID band Patient awake    Reviewed: Allergy & Precautions, H&P , Patient's Chart, lab work & pertinent test results  Airway Mallampati: II  TM Distance: >3 FB Neck ROM: full    Dental no notable dental hx.    Pulmonary    Pulmonary exam normal breath sounds clear to auscultation       Cardiovascular Exercise Tolerance: Good  Rhythm:regular Rate:Normal     Neuro/Psych    GI/Hepatic   Endo/Other    Renal/GU      Musculoskeletal   Abdominal   Peds  Hematology  (+) anemia ,   Anesthesia Other Findings Hx of GA/ETT ... Used Glide for Black & DeckerMyleopathy( Difficulty??)   Anemia  MO  Itching with prev CLE  Reproductive/Obstetrics                             Anesthesia Physical Anesthesia Plan  ASA: III  Anesthesia Plan: Spinal, Combined Spinal and Epidural and Epidural   Post-op Pain Management:    Induction:   Airway Management Planned:   Additional Equipment:   Intra-op Plan:   Post-operative Plan:   Informed Consent: I have reviewed the patients History and Physical, chart, labs and discussed the procedure including the risks, benefits and alternatives for the proposed anesthesia with the patient or authorized representative who has indicated his/her understanding and acceptance.   Dental Advisory Given  Plan Discussed with: CRNA  Anesthesia Plan Comments: (Lab work confirmed with CRNA in room. Platelets okay. Discussed spinal anesthetic, and patient consents to the procedure:  included risk of possible headache,backache, failed block, allergic reaction, and nerve injury. This patient was asked if she had any questions or concerns before the procedure started. )        Anesthesia Quick Evaluation

## 2015-11-02 ENCOUNTER — Inpatient Hospital Stay (HOSPITAL_COMMUNITY)
Admission: RE | Admit: 2015-11-02 | Discharge: 2015-11-05 | DRG: 766 | Disposition: A | Discharge status: Home / Self Care | Payer: 59 | Source: Ambulatory Visit | Attending: Obstetrics and Gynecology | Admitting: Obstetrics and Gynecology

## 2015-11-02 ENCOUNTER — Encounter (HOSPITAL_COMMUNITY)
Admission: RE | Disposition: A | Discharge status: Home / Self Care | Payer: Self-pay | Source: Ambulatory Visit | Attending: Obstetrics and Gynecology

## 2015-11-02 ENCOUNTER — Inpatient Hospital Stay (HOSPITAL_COMMUNITY): Payer: 59 | Admitting: Anesthesiology

## 2015-11-02 ENCOUNTER — Encounter (HOSPITAL_COMMUNITY): Payer: Self-pay

## 2015-11-02 DIAGNOSIS — Z8249 Family history of ischemic heart disease and other diseases of the circulatory system: Secondary | ICD-10-CM

## 2015-11-02 DIAGNOSIS — O34211 Maternal care for low transverse scar from previous cesarean delivery: Principal | ICD-10-CM | POA: Diagnosis present

## 2015-11-02 DIAGNOSIS — O9902 Anemia complicating childbirth: Secondary | ICD-10-CM | POA: Diagnosis present

## 2015-11-02 DIAGNOSIS — O99344 Other mental disorders complicating childbirth: Secondary | ICD-10-CM | POA: Diagnosis present

## 2015-11-02 DIAGNOSIS — Z833 Family history of diabetes mellitus: Secondary | ICD-10-CM | POA: Diagnosis not present

## 2015-11-02 DIAGNOSIS — Z3490 Encounter for supervision of normal pregnancy, unspecified, unspecified trimester: Secondary | ICD-10-CM

## 2015-11-02 DIAGNOSIS — Z3A39 39 weeks gestation of pregnancy: Secondary | ICD-10-CM | POA: Diagnosis not present

## 2015-11-02 DIAGNOSIS — D573 Sickle-cell trait: Secondary | ICD-10-CM | POA: Diagnosis present

## 2015-11-02 DIAGNOSIS — F419 Anxiety disorder, unspecified: Secondary | ICD-10-CM | POA: Diagnosis present

## 2015-11-02 LAB — RPR: RPR: NONREACTIVE

## 2015-11-02 SURGERY — Surgical Case
Anesthesia: Epidural

## 2015-11-02 MED ORDER — SCOPOLAMINE 1 MG/3DAYS TD PT72
MEDICATED_PATCH | TRANSDERMAL | Status: AC
  Administered 2015-11-02: 1.5 mg via TRANSDERMAL
  Filled 2015-11-02: qty 1

## 2015-11-02 MED ORDER — LACTATED RINGERS IV SOLN
INTRAVENOUS | Status: DC
  Administered 2015-11-02 (×2): via INTRAVENOUS

## 2015-11-02 MED ORDER — PROMETHAZINE HCL 25 MG/ML IJ SOLN
6.2500 mg | INTRAMUSCULAR | Status: DC | PRN
  Administered 2015-11-02: 6.25 mg via INTRAVENOUS

## 2015-11-02 MED ORDER — MEASLES, MUMPS & RUBELLA VAC ~~LOC~~ INJ
0.5000 mL | INJECTION | Freq: Once | SUBCUTANEOUS | Status: DC
  Filled 2015-11-02: qty 0.5

## 2015-11-02 MED ORDER — ACETAMINOPHEN 160 MG/5ML PO SOLN
ORAL | Status: AC
  Administered 2015-11-02: 975 mg via ORAL
  Filled 2015-11-02: qty 40.6

## 2015-11-02 MED ORDER — ACETAMINOPHEN 160 MG/5ML PO SOLN
975.0000 mg | Freq: Once | ORAL | Status: AC
  Administered 2015-11-02: 975 mg via ORAL

## 2015-11-02 MED ORDER — MEPERIDINE HCL 25 MG/ML IJ SOLN
6.2500 mg | INTRAMUSCULAR | Status: DC | PRN
Start: 2015-11-02 — End: 2015-11-02

## 2015-11-02 MED ORDER — NALBUPHINE HCL 10 MG/ML IJ SOLN: 5.0000 mg | Freq: Once | INTRAMUSCULAR | Status: AC | PRN

## 2015-11-02 MED ORDER — ACETAMINOPHEN 500 MG PO TABS
1000.0000 mg | ORAL_TABLET | Freq: Four times a day (QID) | ORAL | Status: AC
  Administered 2015-11-02 – 2015-11-03 (×3): 1000 mg via ORAL
  Filled 2015-11-02 (×3): qty 2

## 2015-11-02 MED ORDER — SCOPOLAMINE 1 MG/3DAYS TD PT72: 1.0000 | MEDICATED_PATCH | Freq: Once | TRANSDERMAL | Status: DC

## 2015-11-02 MED ORDER — ONDANSETRON HCL 4 MG/2ML IJ SOLN
INTRAMUSCULAR | Status: DC | PRN
  Administered 2015-11-02: 4 mg via INTRAVENOUS

## 2015-11-02 MED ORDER — DEXAMETHASONE SODIUM PHOSPHATE 4 MG/ML IJ SOLN
INTRAMUSCULAR | Status: DC | PRN
  Administered 2015-11-02: 4 mg via INTRAVENOUS

## 2015-11-02 MED ORDER — COCONUT OIL OIL: 1.0000 "application " | TOPICAL_OIL | Status: DC | PRN

## 2015-11-02 MED ORDER — LACTATED RINGERS IV SOLN
INTRAVENOUS | Status: DC
  Administered 2015-11-02 (×3): via INTRAVENOUS

## 2015-11-02 MED ORDER — PHENYLEPHRINE 8 MG IN D5W 100 ML (0.08MG/ML) PREMIX OPTIME
INJECTION | INTRAVENOUS | Status: AC
  Filled 2015-11-02: qty 100

## 2015-11-02 MED ORDER — DIPHENHYDRAMINE HCL 50 MG/ML IJ SOLN
12.5000 mg | INTRAMUSCULAR | Status: DC | PRN
  Administered 2015-11-02 – 2015-11-03 (×5): 12.5 mg via INTRAVENOUS
  Filled 2015-11-02 (×5): qty 1

## 2015-11-02 MED ORDER — SCOPOLAMINE 1 MG/3DAYS TD PT72
1.0000 | MEDICATED_PATCH | Freq: Once | TRANSDERMAL | Status: DC
  Administered 2015-11-02: 1.5 mg via TRANSDERMAL

## 2015-11-02 MED ORDER — LIDOCAINE-EPINEPHRINE 2 %-1:100000 IJ SOLN
INTRAMUSCULAR | Status: DC | PRN
  Administered 2015-11-02: 2 mL via INTRADERMAL

## 2015-11-02 MED ORDER — OXYCODONE-ACETAMINOPHEN 5-325 MG PO TABS
1.0000 | ORAL_TABLET | ORAL | Status: DC | PRN
  Administered 2015-11-03 (×2): 1 via ORAL
  Filled 2015-11-02 (×4): qty 1

## 2015-11-02 MED ORDER — PHENYLEPHRINE HCL 10 MG/ML IJ SOLN
INTRAMUSCULAR | Status: DC | PRN
  Administered 2015-11-02: 120 ug via INTRAVENOUS
  Administered 2015-11-02: 80 ug via INTRAVENOUS

## 2015-11-02 MED ORDER — ZOLPIDEM TARTRATE 5 MG PO TABS: 5.0000 mg | ORAL_TABLET | Freq: Every evening | ORAL | Status: DC | PRN

## 2015-11-02 MED ORDER — SODIUM CHLORIDE 0.9% FLUSH: 3.0000 mL | INTRAVENOUS | Status: DC | PRN

## 2015-11-02 MED ORDER — MORPHINE SULFATE-NACL 0.5-0.9 MG/ML-% IV SOSY
PREFILLED_SYRINGE | INTRAVENOUS | Status: AC
  Filled 2015-11-02: qty 1

## 2015-11-02 MED ORDER — NALOXONE HCL 2 MG/2ML IJ SOSY
1.0000 ug/kg/h | PREFILLED_SYRINGE | INTRAVENOUS | Status: DC | PRN
  Filled 2015-11-02: qty 2

## 2015-11-02 MED ORDER — PROMETHAZINE HCL 25 MG/ML IJ SOLN
INTRAMUSCULAR | Status: AC
  Administered 2015-11-02: 6.25 mg via INTRAVENOUS
  Filled 2015-11-02: qty 1

## 2015-11-02 MED ORDER — NALBUPHINE HCL 10 MG/ML IJ SOLN
INTRAMUSCULAR | Status: AC
  Administered 2015-11-02: 5 mg via INTRAVENOUS
  Filled 2015-11-02: qty 1

## 2015-11-02 MED ORDER — ONDANSETRON HCL 4 MG/2ML IJ SOLN
INTRAMUSCULAR | Status: AC
  Filled 2015-11-02: qty 2

## 2015-11-02 MED ORDER — SENNOSIDES-DOCUSATE SODIUM 8.6-50 MG PO TABS
2.0000 | ORAL_TABLET | ORAL | Status: DC
  Administered 2015-11-03 – 2015-11-04 (×3): 2 via ORAL
  Filled 2015-11-02 (×3): qty 2

## 2015-11-02 MED ORDER — MIDAZOLAM HCL 2 MG/2ML IJ SOLN
INTRAMUSCULAR | Status: DC | PRN
  Administered 2015-11-02: 2 mg via INTRAVENOUS

## 2015-11-02 MED ORDER — OXYTOCIN 40 UNITS IN LACTATED RINGERS INFUSION - SIMPLE MED: 2.5000 [IU]/h | INTRAVENOUS | Status: AC

## 2015-11-02 MED ORDER — OXYTOCIN 10 UNIT/ML IJ SOLN
INTRAMUSCULAR | Status: AC
  Filled 2015-11-02: qty 4

## 2015-11-02 MED ORDER — ONDANSETRON HCL 4 MG/2ML IJ SOLN: 4.0000 mg | Freq: Three times a day (TID) | INTRAMUSCULAR | Status: DC | PRN

## 2015-11-02 MED ORDER — LACTATED RINGERS IV SOLN
Freq: Once | INTRAVENOUS | Status: AC
  Administered 2015-11-02: 07:00:00 via INTRAVENOUS

## 2015-11-02 MED ORDER — FERROUS SULFATE 325 (65 FE) MG PO TABS
325.0000 mg | ORAL_TABLET | Freq: Two times a day (BID) | ORAL | Status: DC
  Administered 2015-11-02 – 2015-11-05 (×6): 325 mg via ORAL
  Filled 2015-11-02 (×6): qty 1

## 2015-11-02 MED ORDER — PHENYLEPHRINE 40 MCG/ML (10ML) SYRINGE FOR IV PUSH (FOR BLOOD PRESSURE SUPPORT)
PREFILLED_SYRINGE | INTRAVENOUS | Status: AC
  Filled 2015-11-02: qty 10

## 2015-11-02 MED ORDER — TETANUS-DIPHTH-ACELL PERTUSSIS 5-2.5-18.5 LF-MCG/0.5 IM SUSP
0.5000 mL | Freq: Once | INTRAMUSCULAR | Status: AC
  Administered 2015-11-03: 0.5 mL via INTRAMUSCULAR
  Filled 2015-11-02: qty 0.5

## 2015-11-02 MED ORDER — OXYCODONE-ACETAMINOPHEN 5-325 MG PO TABS
2.0000 | ORAL_TABLET | ORAL | Status: DC | PRN
  Administered 2015-11-03 – 2015-11-04 (×4): 2 via ORAL
  Filled 2015-11-02 (×3): qty 2

## 2015-11-02 MED ORDER — NALBUPHINE HCL 10 MG/ML IJ SOLN
5.0000 mg | INTRAMUSCULAR | Status: DC | PRN
  Administered 2015-11-02 – 2015-11-03 (×6): 5 mg via INTRAVENOUS
  Filled 2015-11-02 (×5): qty 1

## 2015-11-02 MED ORDER — FENTANYL CITRATE (PF) 100 MCG/2ML IJ SOLN
INTRAMUSCULAR | Status: AC
  Filled 2015-11-02: qty 2

## 2015-11-02 MED ORDER — SODIUM CHLORIDE 0.9 % IV SOLN: Freq: Once | INTRAVENOUS | Status: DC

## 2015-11-02 MED ORDER — NALOXONE HCL 0.4 MG/ML IJ SOLN: 0.4000 mg | INTRAMUSCULAR | Status: DC | PRN

## 2015-11-02 MED ORDER — SIMETHICONE 80 MG PO CHEW
80.0000 mg | CHEWABLE_TABLET | ORAL | Status: DC
  Administered 2015-11-03 – 2015-11-04 (×3): 80 mg via ORAL
  Filled 2015-11-02 (×3): qty 1

## 2015-11-02 MED ORDER — IBUPROFEN 600 MG PO TABS
600.0000 mg | ORAL_TABLET | Freq: Four times a day (QID) | ORAL | Status: DC
  Administered 2015-11-02 – 2015-11-05 (×13): 600 mg via ORAL
  Filled 2015-11-02 (×13): qty 1

## 2015-11-02 MED ORDER — BUPIVACAINE IN DEXTROSE 0.75-8.25 % IT SOLN
INTRATHECAL | Status: AC
  Filled 2015-11-02: qty 4

## 2015-11-02 MED ORDER — FENTANYL CITRATE (PF) 100 MCG/2ML IJ SOLN
INTRAMUSCULAR | Status: DC | PRN
  Administered 2015-11-02: 25 ug via EPIDURAL

## 2015-11-02 MED ORDER — PHENYLEPHRINE 8 MG IN D5W 100 ML (0.08MG/ML) PREMIX OPTIME
INJECTION | INTRAVENOUS | Status: DC | PRN
  Administered 2015-11-02: 60 ug/min via INTRAVENOUS

## 2015-11-02 MED ORDER — SODIUM CHLORIDE 0.9 % IR SOLN
Status: DC | PRN
  Administered 2015-11-02: 1

## 2015-11-02 MED ORDER — DIBUCAINE 1 % RE OINT: 1.0000 "application " | TOPICAL_OINTMENT | RECTAL | Status: DC | PRN

## 2015-11-02 MED ORDER — WITCH HAZEL-GLYCERIN EX PADS: 1.0000 "application " | MEDICATED_PAD | CUTANEOUS | Status: DC | PRN

## 2015-11-02 MED ORDER — DIPHENHYDRAMINE HCL 25 MG PO CAPS: 25.0000 mg | ORAL_CAPSULE | ORAL | Status: DC | PRN

## 2015-11-02 MED ORDER — ACETAMINOPHEN 325 MG PO TABS: 650.0000 mg | ORAL_TABLET | ORAL | Status: DC | PRN

## 2015-11-02 MED ORDER — MIDAZOLAM HCL 2 MG/2ML IJ SOLN
INTRAMUSCULAR | Status: AC
  Filled 2015-11-02: qty 2

## 2015-11-02 MED ORDER — LACTATED RINGERS IV SOLN
INTRAVENOUS | Status: DC | PRN
  Administered 2015-11-02: 08:00:00 via INTRAVENOUS

## 2015-11-02 MED ORDER — NALBUPHINE HCL 10 MG/ML IJ SOLN
5.0000 mg | Freq: Once | INTRAMUSCULAR | Status: AC | PRN
  Administered 2015-11-02: 5 mg via INTRAVENOUS

## 2015-11-02 MED ORDER — FENTANYL CITRATE (PF) 100 MCG/2ML IJ SOLN
25.0000 ug | INTRAMUSCULAR | Status: DC | PRN
  Administered 2015-11-02: 25 ug via INTRAVENOUS

## 2015-11-02 MED ORDER — FENTANYL CITRATE (PF) 100 MCG/2ML IJ SOLN
INTRAMUSCULAR | Status: AC
  Administered 2015-11-02: 25 ug via INTRAVENOUS
  Filled 2015-11-02: qty 2

## 2015-11-02 MED ORDER — DEXAMETHASONE SODIUM PHOSPHATE 4 MG/ML IJ SOLN
INTRAMUSCULAR | Status: AC
  Filled 2015-11-02: qty 1

## 2015-11-02 MED ORDER — OXYTOCIN 10 UNIT/ML IJ SOLN
INTRAVENOUS | Status: DC | PRN
  Administered 2015-11-02: 40 [IU] via INTRAVENOUS

## 2015-11-02 MED ORDER — NALBUPHINE HCL 10 MG/ML IJ SOLN
5.0000 mg | INTRAMUSCULAR | Status: DC | PRN
  Administered 2015-11-03: 5 mg via SUBCUTANEOUS
  Filled 2015-11-02: qty 1

## 2015-11-02 MED ORDER — PRENATAL MULTIVITAMIN CH
1.0000 | ORAL_TABLET | Freq: Every day | ORAL | Status: DC
  Administered 2015-11-03 – 2015-11-05 (×3): 1 via ORAL
  Filled 2015-11-02 (×3): qty 1

## 2015-11-02 SURGICAL SUPPLY — 28 items
BENZOIN TINCTURE PRP APPL 2/3 (GAUZE/BANDAGES/DRESSINGS) ×3 IMPLANT
CHLORAPREP W/TINT 26ML (MISCELLANEOUS) ×3 IMPLANT
CLAMP CORD UMBIL (MISCELLANEOUS) IMPLANT
CLOSURE STERI-STRIP 1/2X4 (GAUZE/BANDAGES/DRESSINGS) ×1
CLOTH BEACON ORANGE TIMEOUT ST (SAFETY) ×3 IMPLANT
CLSR STERI-STRIP ANTIMIC 1/2X4 (GAUZE/BANDAGES/DRESSINGS) ×2 IMPLANT
CONTAINER PREFILL 10% NBF 15ML (MISCELLANEOUS) IMPLANT
DRSG OPSITE POSTOP 4X10 (GAUZE/BANDAGES/DRESSINGS) ×3 IMPLANT
ELECT REM PT RETURN 9FT ADLT (ELECTROSURGICAL) ×3
ELECTRODE REM PT RTRN 9FT ADLT (ELECTROSURGICAL) ×1 IMPLANT
EXTRACTOR VACUUM M CUP 4 TUBE (SUCTIONS) ×2 IMPLANT
EXTRACTOR VACUUM M CUP 4' TUBE (SUCTIONS) ×1
GLOVE BIOGEL PI IND STRL 7.0 (GLOVE) ×1 IMPLANT
GLOVE BIOGEL PI INDICATOR 7.0 (GLOVE) ×2
GLOVE ECLIPSE 7.0 STRL STRAW (GLOVE) ×6 IMPLANT
GOWN STRL REUS W/TWL LRG LVL3 (GOWN DISPOSABLE) ×6 IMPLANT
KIT ABG SYR 3ML LUER SLIP (SYRINGE) IMPLANT
NEEDLE HYPO 25X5/8 SAFETYGLIDE (NEEDLE) IMPLANT
NS IRRIG 1000ML POUR BTL (IV SOLUTION) ×3 IMPLANT
PACK C SECTION WH (CUSTOM PROCEDURE TRAY) ×3 IMPLANT
PAD OB MATERNITY 4.3X12.25 (PERSONAL CARE ITEMS) ×3 IMPLANT
SUT MNCRL 0 VIOLET CTX 36 (SUTURE) ×5 IMPLANT
SUT MON AB 2-0 CT1 27 (SUTURE) ×6 IMPLANT
SUT MONOCRYL 0 CTX 36 (SUTURE) ×10
SUT PLAIN 0 NONE (SUTURE) IMPLANT
SUT VIC AB 4-0 KS 27 (SUTURE) ×3 IMPLANT
TOWEL OR 17X24 6PK STRL BLUE (TOWEL DISPOSABLE) ×3 IMPLANT
TRAY FOLEY CATH SILVER 14FR (SET/KITS/TRAYS/PACK) IMPLANT

## 2015-11-02 NOTE — Anesthesia Procedure Notes (Signed)
Spinal  Patient location during procedure: OR Staffing Anesthesiologist: Cristela BlueJACKSON, April Timothy Preanesthetic Checklist Completed: patient identified, site marked, surgical consent, pre-op evaluation, timeout performed, IV checked, risks and benefits discussed and monitors and equipment checked Spinal Block Patient position: sitting Prep: DuraPrep Patient monitoring: cardiac monitor, continuous pulse ox, blood pressure and heart rate Approach: midline Location: L3-4 Injection technique: catheter Needle Needle type: Tuohy and Sprotte  Needle gauge: 24 G Needle length: 12.7 cm Needle insertion depth: 7 cm Catheter type: closed end flexible Catheter size: 19 g Catheter at skin depth: 13 cm Additional Notes Spinal Dosage in OR  Bupivicaine ml       1.3 PFMS04   mcg        100

## 2015-11-02 NOTE — H&P (Signed)
April [name] is an 40 y.o. [phone] [redacted]w[redacted]d black female who presents to the ER for a repeat C/S. PNC was complicated by AMA/sickle cell trait/anemia. The NIPT indicated that the baby was a nl boy. FOB is sickle neg. Pt has two units T&Cd.  Chief Complaint: HPI:  Past Medical History:  Diagnosis Date  . AMA (advanced maternal age) multigravida 35+   . Anemia    Sickle cell trait, takes iron supplement  . Anxiety    PAtient has bad anxiety  . Complication of anesthesia    has a hard time waking up, itching and hives from epidural  . Ectopic pregnancy 2003  . Headache(784.0)    migraines   . Hx of varicella   . Sickle cell trait [facility])     Past Surgical History:  Procedure Laterality Date  . ANTERIOR CERVICAL DECOMP/DISCECTOMY FUSION N/A 09/08/2012   Procedure: ANTERIOR CERVICAL DECOMPRESSION/DISCECTOMY FUSION 1 LEVEL;  Surgeon: Temple Pacini, MD;  Location: [facility];  Service: Neurosurgery;  Laterality: N/A;  Cervical six-seven anterior cervical decompression with fusion interbody prothesis plating and bonegraft  . CESAREAN SECTION    . DILATION AND CURETTAGE OF UTERUS  2012  . HERNIA REPAIR      Family History  Problem Relation Age of Onset  . Diabetes Mother   . Cancer Mother     colon  . Diabetes Brother   . Hypertension Brother   . Learning disabilities Brother   . Cancer Father     colon  . Asthma Daughter   . Vesicoureteral reflux Son   . Diabetes Maternal Aunt   . Diabetes Maternal Uncle   . Diabetes Maternal Grandmother   . Cancer Paternal Grandfather     colon  . Heart attack Paternal Grandfather   . Diabetes Brother    Social History:  reports that she has never smoked. She has never used smokeless tobacco. She reports that she does not drink alcohol or use drugs.  Allergies:  Allergies  Allergen Reactions  . Latex   . Other Itching and Rash    Some med used in epidurals    Medications Prior to Admission  Medication Sig Dispense Refill  .  acetaminophen (TYLENOL) 325 MG tablet Take 650 mg by mouth every 6 (six) hours as needed for mild pain.    . ferrous sulfate 325 (65 FE) MG tablet Take 325 mg by mouth 2 (two) times daily with a meal.     . Prenatal Vit-Min-FA-Fish Oil ([employer] PRENATAL GUMMY PO) Take 1 tablet by mouth daily.    Marland Kitchen zolpidem (AMBIEN) 5 MG tablet Take 5 mg by mouth at bedtime as needed for sleep.    . butalbital-acetaminophen-caffeine (FIORICET) 50-325-40 MG tablet Take 1-2 tablets by mouth every 6 (six) hours as needed for headache. (Patient not taking: Reported on 10/20/2015) 20 tablet 0  . diazepam (VALIUM) 5 MG tablet Take 1-2 tablets (5-10 mg total) by mouth every 6 (six) hours as needed (Spasms). (Patient not taking: Reported on 10/20/2015) 60 tablet 0  . oxyCODONE-acetaminophen (PERCOCET/ROXICET) 5-325 MG per tablet Take 1-2 tablets by mouth every 4 (four) hours as needed for pain. (Patient not taking: Reported on 10/20/2015) 90 tablet 0       Blood pressure 112/66, pulse 93, temperature 98.1 F (36.7 C), temperature source Oral, resp. rate 18, last menstrual period 01/31/2015, SpO2 100 %. General appearance: cooperative and no distress Lungs: clear to auscultation bilaterally Heart: regular rate and rhythm, S1, S2 normal,  no murmur, click, rub or gallop Abdomen: soft, non-tender; bowel sounds normal; no masses,  no organomegaly, gravid   Lab Results  Component Value Date   WBC 5.6 11/01/2015   HGB 9.3 (L) 11/01/2015   HCT 28.4 (L) 11/01/2015   MCV 62.3 (L) 11/01/2015   PLT 197 11/01/2015   Lab Results  Component Value Date   PREGSERUM NEGATIVE 09/08/2012      Patient Active Problem List   Diagnosis Date Noted  . HNP (herniated nucleus pulposus), cervical 09/08/2012   IMP/ 1) IUP at term          2) AMA          3) + sickle cell trait          4) anemia Plan/ Proceed with repeat C/S Asheley Hellberg E 11/02/2015, 7:12 AM

## 2015-11-02 NOTE — Progress Notes (Signed)
On assessment BP noted to be 99/44 pulse 75.  Pt asymptomatic with good urine output that is clear yellow.  Dr. Dareen PianoAnderson notified, no new orders at this time.     Blood noted on pressure dressing, when dressing was lifted, honeycomb noted to be saturated. Dr. Dareen PianoAnderson notified and ordered dressing to be changed with pressure dressing reapplied.

## 2015-11-02 NOTE — Transfer of Care (Signed)
Immediate Anesthesia Transfer of Care Note  Patient: Norva KarvonenLeslie N Brasil  Procedure(s) Performed: Procedure(s): REPEAT CESAREAN SECTION (N/A)  Patient Location: PACU  Anesthesia Type:Spinal and Epidural  Level of Consciousness: awake, alert  and oriented  Airway & Oxygen Therapy: Patient Spontanous Breathing  Post-op Assessment: Report given to RN and Post -op Vital signs reviewed and stable  Post vital signs: Reviewed and stable  Last Vitals:  Vitals:   11/02/15 0610  BP: 112/66  Pulse: 93  Resp: 18  Temp: 36.7 C    Last Pain:  Vitals:   11/02/15 0610  TempSrc: Oral      Patients Stated Pain Goal: 3 (11/02/15 0610)  Complications: No apparent anesthesia complications

## 2015-11-02 NOTE — Lactation Note (Signed)
This note was copied from a baby's chart. Lactation Consultation Note  Patient Name: April [name] RilingLeslie Dada VOZDG'UToday's Date: 11/02/2015 Reason for consult: Initial assessment Baby at 10 hr of life. Mom is worried that her L breast is not producing milk. Demonstrated manual expression, colostrum noted bilaterally, spoon in room. She denies breast or nipple pain. Discussed baby behavior, feeding frequency, pacifier use, baby belly size, voids, wt loss, breast changes, and nipple care. Given lactation handouts. Aware of OP services and support group.     Maternal Data Has patient been taught Hand Expression?: Yes Does the patient have breastfeeding experience prior to this delivery?: Yes  Feeding Feeding Type: Breast Fed Length of feed: 5 min  LATCH Score/Interventions Latch: Grasps breast easily, tongue down, lips flanged, rhythmical sucking.  Audible Swallowing: Spontaneous and intermittent  Type of Nipple: Everted at rest and after stimulation  Comfort (Breast/Nipple): Soft / non-tender     Hold (Positioning): Assistance needed to correctly position infant at breast and maintain latch. Intervention(s): Position options;Support Pillows  LATCH Score: 9  Lactation Tools Discussed/Used WIC Program: No   Consult Status Consult Status: Follow-up Date: 11/03/15 Follow-up type: In-patient    Rulon Eisenmengerlizabeth E Brooklyne Radke 11/02/2015, 6:09 PM

## 2015-11-02 NOTE — Anesthesia Postprocedure Evaluation (Signed)
Anesthesia Post Note  Patient: April [name]  Procedure(s) Performed: Procedure(s) (LRB): REPEAT CESAREAN SECTION (N/A)  Patient location during evaluation: Mother Baby Anesthesia Type: Combined Spinal/Epidural Level of consciousness: awake and alert and oriented Pain management: satisfactory to patient Vital Signs Assessment: post-procedure vital signs reviewed and stable Respiratory status: respiratory function stable and spontaneous breathing Cardiovascular status: blood pressure returned to baseline Postop Assessment: no headache, no backache, spinal receding, patient able to bend at knees and adequate PO intake Anesthetic complications: no     Last Vitals:  Vitals:   11/02/15 1255 11/02/15 1355  BP: (!) 100/57 (!) 99/44  Pulse: 72 75  Resp: 18 16  Temp: 36.7 C 36.6 C    Last Pain:  Vitals:   11/02/15 1355  TempSrc: Oral  PainSc: 3    Pain Goal: Patients Stated Pain Goal: 3 (11/02/15 1355)               Senica Crall

## 2015-11-03 LAB — CBC
HEMATOCRIT: 22.4 % — AB (ref 36.0–46.0)
Hemoglobin: 7.1 g/dL — ABNORMAL LOW (ref 12.0–15.0)
MCH: 19.9 pg — ABNORMAL LOW (ref 26.0–34.0)
MCHC: 31.7 g/dL (ref 30.0–36.0)
MCV: 62.9 fL — AB (ref 78.0–100.0)
PLATELETS: 173 10*3/uL (ref 150–400)
RBC: 3.56 MIL/uL — ABNORMAL LOW (ref 3.87–5.11)
RDW: 18.1 % — AB (ref 11.5–15.5)
WBC: 7.4 10*3/uL (ref 4.0–10.5)

## 2015-11-03 LAB — BIRTH TISSUE RECOVERY COLLECTION (PLACENTA DONATION)

## 2015-11-03 MED ORDER — HYDROXYZINE HCL 50 MG/ML IM SOLN
100.0000 mg | Freq: Four times a day (QID) | INTRAMUSCULAR | Status: DC | PRN
  Administered 2015-11-03 (×2): 100 mg via INTRAMUSCULAR
  Filled 2015-11-03 (×4): qty 2

## 2015-11-03 NOTE — Progress Notes (Signed)
  Patient is eating, ambulating, voiding.  Pain control is good.  Vitals:   11/02/15 1620 11/02/15 2030 11/03/15 0030 11/03/15 0500  BP:  (!) 93/56 109/68 (!) 99/54  Pulse:  61 62 63  Resp:  18 16 18   Temp:  97.7 F (36.5 C) 97.6 F (36.4 C) 97.7 F (36.5 C)  TempSrc:  Oral Axillary Axillary  SpO2: 100% 100% 99% 100%  Weight:      Height:        lungs:   clear to auscultation cor:    RRR Abdomen:  soft, appropriate tenderness, incisions intact and without erythema or exudate ex:    no cords   Lab Results  Component Value Date   WBC 7.4 11/03/2015   HGB 7.1 (L) 11/03/2015   HCT 22.4 (L) 11/03/2015   MCV 62.9 (L) 11/03/2015   PLT 173 11/03/2015    --/--/AB POS, AB POS (09/05 1020)/RI  A/P    Post operative day 1. Iron and anemia precautions.  Routine post op and postpartum care.  Expect d/c routine.  Percocet for pain control.

## 2015-11-03 NOTE — Progress Notes (Signed)
Baby being evaluated for some greenish "spit up."  No circ today.

## 2015-11-03 NOTE — Clinical Social Work Maternal (Signed)
  CLINICAL SOCIAL WORK MATERNAL/CHILD NOTE  Patient Details  Name: April [name] MRN: [id number] Date of Birth: 1975/06/29  Date:  11/03/2015  Clinical Social Worker Initiating Note:  Ferdinand Lango Orvil Faraone, MSW, LCSW-A Date/ Time Initiated:  11/03/15/1105     Child's Name:  Joellyn Quails    Legal Guardian:  Mother   Need for Interpreter:  None   Date of Referral:  11/02/15     Reason for Referral:  Other (Comment) (MOB hx of anxiety )   Referral Source:  Physician   Address:  [address] [place], [place] [address]  Phone number:  [phone]   Household Members:  Self, Minor Children, Significant Other   Natural Supports (not living in the home):  Immediate Family, Extended Family, Friends   Medical illustrator Supports:     Employment:     Type of Work:     Education:      Pensions consultant:      Other Resources:      Cultural/Religious Considerations Which May Impact Care:  Per face sheet none reported   Strengths:  Ability to meet basic needs , Home prepared for child , Pediatrician chosen    Risk Factors/Current Problems:      Cognitive State:  Alert    Mood/Affect:  Happy , Interested , Bright    CSW Assessment: CSW met with MOB at bedside. This Probation officer explained role and reasoning for visit being due to there being a hx of anxiety. At this time MOB confirms having a hx of anxiety; however, has never been treated for it with medication and has no desire to. MOB confirmed supports and home being prepared for baby. This Probation officer discussed PPD and SIDS. No other needs addressed or requested at this time. Case closed to this CSW.   CSW Plan/Description:  No Further Intervention Required/No Barriers to Discharge    Fajardo, LCSW 11/03/2015, 11:07 AM

## 2015-11-03 NOTE — Op Note (Signed)
[name]:  [name], April              ACCOUNT NO.:  [id number]  MEDICAL RECORD NO.:  [id number]  LOCATION:  9131                          FACILITY:  [facility]  PHYSICIAN:  Malva LimesMark Anderson, M.D.    DATE OF BIRTH:  02-14-1976  DATE OF PROCEDURE:  11/02/2015 DATE OF DISCHARGE:                              OPERATIVE REPORT   PREOPERATIVE DIAGNOSES: 1. Intrauterine pregnancy at term. 2. Advanced maternal age. 3. History of prior cesarean sections x3.  POSTOPERATIVE DIAGNOSES: 1. Intrauterine pregnancy at term. 2. Advanced maternal age. 3. History of prior cesarean sections x3.  PROCEDURE:  Repeat low transverse cesarean section.  SURGEON:  Malva LimesMark Anderson, M.D.  ASSISChestine Spore:  Clark.  ANESTHESIA:  Spinal antibiotics, Ancef 2 g.  DRAINS:  Foley, bedside drainage.  ESTIMATED BLOOD LOSS:  900 mL.  SPECIMENS:  None.  COMPLICATIONS:  None.  PROCEDURE IN DETAIL:  The patient was taken to the operating room, where spinal anesthetic was administered.  She was then placed in dorsal supine position with a left lateral tilt.  She was prepped and draped in the usual fashion for this procedure.  A Foley catheter was placed in her bladder.  A Pfannenstiel incision was made in the area of previous scars.  On entering the abdominal cavity, the bladder flap was not taken down.  A low transverse uterine incision was made in the midline and extended laterally with blunt dissection.  The infant was delivered with a vacuum extractor in the vertex presentation.  Amniotic fluid was noted to be clear.  The cord was doubly clamped and cut and the infant handed to the awaiting NICU team.  Cord blood was then obtained.  The placenta was manually removed.  The uterus was exteriorized.  Uterine cavity was wiped with a wet lap.  The uterine incision was closed in a single layer of 0 Monocryl suture in a running, locking fashion.  There was an area of bleeding at the patient's right margin of the incision, which  was closed with 2-0 Monocryl sutures in figure-of-eight fashion.  Hemostasis was rechecked and felt to be hemostatic.  The rectus muscles and parietal peritoneum were reapproximated in midline using 2-0 Monocryl in a running fashion.  The fascia was closed using 0 Monocryl suture in a running fashion.  The Bovie was used to make the subcuticular tissue hemostatic.  The skin was closed with 4-0 Rapide in a subcuticular fashion.  Steri-Strips were placed.  The patient was taken to recovery room in stable condition.  Instrument and lap counts were correct x3.          ______________________________ Malva LimesMark Anderson, M.D.     MA/MEDQ  D:  11/02/2015  T:  11/03/2015  Job:  [id number]

## 2015-11-03 NOTE — Progress Notes (Signed)
Pt had some drainage on bandage over night.  When pt went to shower today, I took off bandages and honeycomb dressing.  Incision intact without bleeding; some scant serous fluid from edges but no flutuants.    Will keep dressing off for a while until sure no longer draining.  Once confirmed, replace honeycomb.

## 2015-11-03 NOTE — Lactation Note (Signed)
This note was copied from a baby's chart. Lactation Consultation Note Baby hasn't wanted to BF. Sleepy, no interest in BF. Spit up clear mucous after birth, now has spit up lime green emesis several times. Mom concerned baby not BF. Mom shown how to use DEBP & how to disassemble, clean, & reassemble parts. Mom knows to pump q3h for 15-20 min. Mom pumping at this time. Hand expression taught w/noted colostrum. Mom has large pendulum breast, large everted nipples. Encouraged STS to stimulate to BF, as well as hand expressing colostrum.  Patient Name: April Haskell RilingLeslie Blouch ZOXWR'UToday's Date: 11/03/2015 Reason for consult: Follow-up assessment;Other (Comment) (baby not BF , spitting lime green emesis )   Maternal Data    Feeding    LATCH Score/Interventions       Type of Nipple: Everted at rest and after stimulation              Lactation Tools Discussed/Used Tools: Pump Breast pump type: Double-Electric Breast Pump Pump Review: Setup, frequency, and cleaning;Milk Storage Initiated by:: Peri JeffersonL. Kamauri Denardo RN IBCLC Date initiated:: 11/03/15   Consult Status Consult Status: Follow-up Date: 11/03/15 Follow-up type: In-patient    Arilyn Brierley, [name] 11/03/2015, 2:22 AM

## 2015-11-03 NOTE — Lactation Note (Signed)
This note was copied from a baby's chart. Lactation Consultation Note  Patient Name: Boy Haskell RilingLeslie Brocksmith ZOXWR'UToday's Date: 11/03/2015 Reason for consult: Follow-up assessment Baby at 32 hr of life. Baby was latched upon entry. Mom is worried because baby is sleepy and spitting up. She has DEBP set up. Encouraged her bf on demand 8+/24hr and pump if baby is too sleepy. Discussed baby behavior, feeding frequency, baby belly size, voids, wt loss, breast changes, and nipple care. She is aware of lactation services and support group. She will call as needed.    Maternal Data    Feeding Feeding Type: Breast Fed Length of feed: 10 min  LATCH Score/Interventions Latch: Grasps breast easily, tongue down, lips flanged, rhythmical sucking.  Audible Swallowing: A few with stimulation Intervention(s): Alternate breast massage  Type of Nipple: Everted at rest and after stimulation  Comfort (Breast/Nipple): Soft / non-tender     Hold (Positioning): No assistance needed to correctly position infant at breast.  LATCH Score: 9  Lactation Tools Discussed/Used     Consult Status Consult Status: Follow-up Date: 11/04/15 Follow-up type: In-patient    Rulon Eisenmengerlizabeth E Windsor Zirkelbach 11/03/2015, 4:59 PM

## 2015-11-03 NOTE — Progress Notes (Signed)
MD called to request order to change dressing because honeycomb appeared saturated below pressure dressing.  MD said that the dressing did not need to be changed at this time.

## 2015-11-04 NOTE — Progress Notes (Signed)
POD#2 Pt without complaints VSSAF IMP/ Stable Plan/routine care

## 2015-11-04 NOTE — Lactation Note (Signed)
This note was copied from a baby's chart. Lactation Consultation Note Baby BF well. Noted feeding cues. Baby crying hungry, tongue curled w/limited lateral movement. Upper labial frenulum thick. Put baby to breast in football position, heard swallows w/compression of breast. Mom has large everted nipples. Baby latches well w/o difficulty. Hand expressed w/easy flow noted. Has DEBP at bedside. No further emesis noted. Out put has increased w/increased BF. Mom feels things are better and is happy baby is BF well.  Discussed supply and demand, STS, hand expression if baby is sleepy to stimulate to latch. Encouraged I&O documentation.  Patient Name: April [name] ZOXWR'UToday's Date: 11/04/2015 Reason for consult: Follow-up assessment;Infant weight loss   Maternal Data    Feeding Feeding Type: Breast Fed Length of feed: 15 min (still BF)  LATCH Score/Interventions Latch: Grasps breast easily, tongue down, lips flanged, rhythmical sucking.  Audible Swallowing: Spontaneous and intermittent Intervention(s): Skin to skin;Hand expression;Alternate breast massage  Type of Nipple: Everted at rest and after stimulation  Comfort (Breast/Nipple): Soft / non-tender     Hold (Positioning): Assistance needed to correctly position infant at breast and maintain latch. Intervention(s): Support Pillows;Position options  LATCH Score: 9  Lactation Tools Discussed/Used Tools: Pump Breast pump type: Double-Electric Breast Pump   Consult Status Consult Status: Follow-up Date: 11/04/15 Follow-up type: In-patient    [name], [name] 11/04/2015, 3:24 AM

## 2015-11-05 LAB — TYPE AND SCREEN
ABO/RH(D): AB POS
Antibody Screen: NEGATIVE
Unit division: 0
Unit division: 0

## 2015-11-05 MED ORDER — OXYCODONE-ACETAMINOPHEN 5-325 MG PO TABS: 1.0000 | ORAL_TABLET | ORAL | 0 refills | Status: AC | PRN

## 2015-11-05 MED ORDER — DIPHENHYDRAMINE HCL 25 MG PO CAPS: 50.0000 mg | ORAL_CAPSULE | Freq: Once | ORAL | Status: DC

## 2015-11-05 NOTE — Progress Notes (Signed)
POD#3 Pt without complaints. Ready for discharge VSSAF IMP/doing well Plan/Discharge

## 2015-11-05 NOTE — Discharge Summary (Signed)
Obstetric Discharge Summary Reason for Admission: cesarean section Prenatal Procedures: ultrasound Intrapartum Procedures: cesarean: low cervical, transverse Postpartum Procedures: none Complications-Operative and Postpartum: none Hemoglobin  Date Value Ref Range Status  11/03/2015 7.1 (L) 12.0 - 15.0 g/dL Final    Comment:    REPEATED TO VERIFY DELTA CHECK NOTED    HCT  Date Value Ref Range Status  11/03/2015 22.4 (L) 36.0 - 46.0 % Final    Physical Exam:  General: alert Lochia: appropriate Uterine Fundus: firm Incision: healing well DVT Evaluation: No evidence of DVT seen on physical exam.  Discharge Diagnoses: Term Pregnancy-delivered and h.o. C/S x 3  Discharge Information: Date: 11/05/2015 Activity: pelvic rest Diet: routine Medications: PNV, Iron and Percocet Condition: stable Instructions: refer to practice specific booklet Discharge to: home Follow-up Information    Levi AlandANDERSON,Favour Aleshire E, MD. Schedule an appointment as soon as possible for a visit in 1 month(s).   Specialty:  Obstetrics and Gynecology Contact information: [address] [place] [id number] [phone]           Newborn Data: Live born female  Birth Weight: 7 lb 13.9 oz (3570 g) APGAR: 8, 9  Home with mother.  Nickey Canedo E 11/05/2015, 7:46 AM

## 2015-11-05 NOTE — Lactation Note (Signed)
This note was copied from a baby's chart. Lactation Consultation Note  Baby 5773 hours old.  11.1% weight loss.  Discussed weight loss with Ex BF mother. Her breasts are filling.  Can easily express breastmilk.  Voids/stools WNL. Mother states baby was spitty and did not breastfeed well for the first 2 days of life.  Weight loss 1st day 5%, 2nd day 7.7%. Per mother Pecola LeisureBaby is breastfeeding much better now, if feeding on demand, cluster fed last night. Recommend mother post pump and hand express today and give additional volume to baby to help stabilize weight loss. Mother in agreement.  Infant will be re-weight later today. Pacifier use not recommended at this time.  Suggest mother call to view latch.  Reviewed engorgement care and monitoring voids/stools.   Patient Name: [name] [id number]'WToday's Date: 11/05/2015 Reason for consult: Follow-up assessment   Maternal Data    Feeding Feeding Type: Breast Fed Length of feed: 25 min  LATCH Score/Interventions                      Lactation Tools Discussed/Used     Consult Status Consult Status: PRN    April [name], April [name] 11/05/2015, 9:17 AM

## 2015-11-05 NOTE — Progress Notes (Signed)
Pt. C/o itching, requesting medication. Only medication ordered IM visteral. Dr. Dareen PianoAnderson called and discussed with him as pt is due to go home today. Order for 50 mg po benadryl given. Discussed with pt medication. Pt chooses to not take any medication as this time but know it is available if needed.

## 2015-11-15 MED FILL — METOCLOPRAMIDE 10 MG TABLET: 10 | 25 days supply | Qty: 100 | Fill #0

## 2015-11-28 ENCOUNTER — Encounter (HOSPITAL_COMMUNITY): Payer: Self-pay | Admitting: *Deleted

## 2017-01-28 MED FILL — POLYMYXIN B/TMP EYE DROPS: 10000-0.1 | 10 days supply | Qty: 10 | Fill #0

## 2017-02-13 MED FILL — LOTEMAX 0.5% GEL: 0.5 | 14 days supply | Qty: 5 | Fill #0

## 2017-05-01 MED FILL — NEO/POLYMYXIN/DEXAMETH DROP: 3.5-10000-0 | 25 days supply | Qty: 5 | Fill #0

## 2018-03-27 MED FILL — CITALOPRAM HBR 20 MG TABLET: 20 | 30 days supply | Qty: 30 | Fill #0

## 2018-03-27 MED FILL — hydrOXYzine HCL 25 MG TABS: 25 | 10 days supply | Qty: 30 | Fill #0

## 2019-03-20 MED FILL — NYSTATIN 100,000 UNIT/GM PO: 100000 | 10 days supply | Qty: 60 | Fill #0

## 2019-03-31 MED FILL — VIT D2 1.25 MG (50,000 UNIT: 1.25 MG | 28 days supply | Qty: 4 | Fill #0

## 2019-03-31 MED FILL — FERROUS SULFATE 325 (65 FE): 325 (65 FE) | 100 days supply | Qty: 100 | Fill #0

## 2019-04-02 MED FILL — PHENTERMINE 37.5 MG TABLET: 37.5 | 30 days supply | Qty: 30 | Fill #0

## 2019-04-06 MED FILL — VALACYCLOVIR HCL 500 MG TAB: 500 | 15 days supply | Qty: 30 | Fill #0

## 2019-06-09 MED FILL — VALACYCLOVIR HCL 500 MG TAB: 500 | 15 days supply | Qty: 30 | Fill #0

## 2019-08-19 MED FILL — RIZATRIPTAN 5 MG TABLET: 5 | 4 days supply | Qty: 4 | Fill #0

## 2019-08-19 MED FILL — ONDANSETRON HCL 4 MG TABLET: 4 | 10 days supply | Qty: 30 | Fill #0

## 2019-09-18 MED FILL — TOPIRAMATE 25 MG TAB: 25 | 30 days supply | Qty: 30 | Fill #0

## 2019-09-18 MED FILL — TOPIRAMATE 100 MG TABLET: 100 | 30 days supply | Qty: 30 | Fill #0

## 2019-09-23 MED FILL — VIT D2 1.25 MG (50,000 UNIT: 1.25 MG | 28 days supply | Qty: 4 | Fill #1

## 2019-11-19 MED FILL — IBUPROFEN 800 MG TAB: 800 | 30 days supply | Qty: 90 | Fill #0

## 2019-11-19 MED FILL — tiZANidine HCL 4 MG TABS: 4 | 10 days supply | Qty: 30 | Fill #0

## 2020-09-20 ENCOUNTER — Other Ambulatory Visit (HOSPITAL_BASED_OUTPATIENT_CLINIC_OR_DEPARTMENT_OTHER): Payer: Self-pay

## 2020-09-21 ENCOUNTER — Other Ambulatory Visit (HOSPITAL_BASED_OUTPATIENT_CLINIC_OR_DEPARTMENT_OTHER): Payer: Self-pay

## 2020-09-21 MED ORDER — HYDROCODONE-ACETAMINOPHEN 5-325 MG PO TABS
ORAL_TABLET | ORAL | 0 refills | Status: AC
  Filled 2020-09-21: qty 20, 5d supply, fill #0

## 2020-09-21 MED ORDER — ERGOCALCIFEROL 1.25 MG (50000 UT) PO CAPS
ORAL_CAPSULE | ORAL | 0 refills | Status: DC
  Filled 2020-09-21 – 2020-10-04 (×2): qty 8, 28d supply, fill #0
  Filled 2021-09-04: qty 8, 28d supply, fill #1

## 2020-09-29 ENCOUNTER — Other Ambulatory Visit (HOSPITAL_BASED_OUTPATIENT_CLINIC_OR_DEPARTMENT_OTHER): Payer: Self-pay

## 2020-10-04 ENCOUNTER — Other Ambulatory Visit (HOSPITAL_BASED_OUTPATIENT_CLINIC_OR_DEPARTMENT_OTHER): Payer: Self-pay

## 2020-10-19 ENCOUNTER — Ambulatory Visit: Payer: Self-pay

## 2020-10-25 ENCOUNTER — Other Ambulatory Visit (HOSPITAL_BASED_OUTPATIENT_CLINIC_OR_DEPARTMENT_OTHER): Payer: Self-pay

## 2021-05-10 ENCOUNTER — Ambulatory Visit: Payer: Self-pay

## 2021-06-15 ENCOUNTER — Other Ambulatory Visit (HOSPITAL_BASED_OUTPATIENT_CLINIC_OR_DEPARTMENT_OTHER): Payer: Self-pay

## 2021-06-15 MED ORDER — HYDROCODONE BIT-HOMATROP MBR 5-1.5 MG/5ML PO SOLN
ORAL | 0 refills | Status: AC
  Filled 2021-06-15: qty 120, 6d supply, fill #0

## 2021-06-15 MED ORDER — PREDNISONE 10 MG PO TABS
ORAL_TABLET | ORAL | 0 refills | Status: DC
  Filled 2021-06-15: qty 21, 6d supply, fill #0

## 2021-06-15 MED ORDER — AZITHROMYCIN 500 MG PO TABS
ORAL_TABLET | ORAL | 1 refills | Status: AC
  Filled 2021-06-15: qty 7, 7d supply, fill #0
  Filled 2022-04-08: qty 7, 7d supply, fill #1

## 2021-06-15 MED ORDER — IPRATROPIUM-ALBUTEROL 0.5-2.5 (3) MG/3ML IN SOLN
RESPIRATORY_TRACT | 1 refills | Status: AC
  Filled 2021-06-15: qty 90, 5d supply, fill #0
  Filled 2021-07-18: qty 90, 5d supply, fill #1

## 2021-06-15 MED ORDER — CETIRIZINE-PSEUDOEPHEDRINE ER 5-120 MG PO TB12
ORAL_TABLET | ORAL | 1 refills | Status: AC
  Filled 2021-06-15: qty 24, 12d supply, fill #0

## 2021-06-16 ENCOUNTER — Other Ambulatory Visit (HOSPITAL_BASED_OUTPATIENT_CLINIC_OR_DEPARTMENT_OTHER): Payer: Self-pay

## 2021-06-23 ENCOUNTER — Other Ambulatory Visit (HOSPITAL_BASED_OUTPATIENT_CLINIC_OR_DEPARTMENT_OTHER): Payer: Self-pay

## 2021-07-04 ENCOUNTER — Other Ambulatory Visit (HOSPITAL_BASED_OUTPATIENT_CLINIC_OR_DEPARTMENT_OTHER): Payer: Self-pay

## 2021-07-04 MED ORDER — BENZONATATE 200 MG PO CAPS
ORAL_CAPSULE | ORAL | 1 refills | Status: AC
  Filled 2021-07-04: qty 45, 15d supply, fill #0

## 2021-07-04 MED ORDER — MONTELUKAST SODIUM 10 MG PO TABS
ORAL_TABLET | ORAL | 1 refills | Status: AC
  Filled 2021-07-04: qty 15, 15d supply, fill #0

## 2021-07-04 MED ORDER — BUDESONIDE 1 MG/2ML IN SUSP
RESPIRATORY_TRACT | 1 refills | Status: AC
  Filled 2021-07-04: qty 60, 30d supply, fill #0
  Filled 2021-08-07: qty 60, 30d supply, fill #1

## 2021-07-18 ENCOUNTER — Other Ambulatory Visit (HOSPITAL_BASED_OUTPATIENT_CLINIC_OR_DEPARTMENT_OTHER): Payer: Self-pay

## 2021-07-20 ENCOUNTER — Other Ambulatory Visit (HOSPITAL_BASED_OUTPATIENT_CLINIC_OR_DEPARTMENT_OTHER): Payer: Self-pay

## 2021-08-07 ENCOUNTER — Other Ambulatory Visit (HOSPITAL_BASED_OUTPATIENT_CLINIC_OR_DEPARTMENT_OTHER): Payer: Self-pay

## 2021-08-14 ENCOUNTER — Other Ambulatory Visit (HOSPITAL_BASED_OUTPATIENT_CLINIC_OR_DEPARTMENT_OTHER): Payer: Self-pay

## 2021-08-14 MED ORDER — ALBUTEROL SULFATE HFA 108 (90 BASE) MCG/ACT IN AERS
INHALATION_SPRAY | RESPIRATORY_TRACT | 11 refills | Status: AC
Start: 2021-08-14 — End: ?
  Filled 2021-08-14 – 2021-09-04 (×2): qty 6.7, 25d supply, fill #0

## 2021-08-14 MED ORDER — BENZONATATE 100 MG PO CAPS
ORAL_CAPSULE | ORAL | 0 refills | Status: AC
  Filled 2021-08-14 – 2021-09-04 (×2): qty 20, 7d supply, fill #0

## 2021-08-14 MED ORDER — OMEPRAZOLE 40 MG PO CPDR
DELAYED_RELEASE_CAPSULE | ORAL | 2 refills | Status: AC
  Filled 2021-08-14 – 2021-09-04 (×2): qty 30, 30d supply, fill #0

## 2021-08-16 ENCOUNTER — Other Ambulatory Visit (HOSPITAL_BASED_OUTPATIENT_CLINIC_OR_DEPARTMENT_OTHER): Payer: Self-pay

## 2021-08-21 ENCOUNTER — Other Ambulatory Visit (HOSPITAL_BASED_OUTPATIENT_CLINIC_OR_DEPARTMENT_OTHER): Payer: Self-pay

## 2021-08-25 ENCOUNTER — Other Ambulatory Visit (HOSPITAL_BASED_OUTPATIENT_CLINIC_OR_DEPARTMENT_OTHER): Payer: Self-pay

## 2021-08-25 MED ORDER — VALACYCLOVIR HCL 500 MG PO TABS
500.0000 mg | ORAL_TABLET | Freq: Every day | ORAL | 0 refills | Status: AC
  Filled 2021-08-25: qty 30, 30d supply, fill #0

## 2021-08-28 ENCOUNTER — Other Ambulatory Visit (HOSPITAL_BASED_OUTPATIENT_CLINIC_OR_DEPARTMENT_OTHER): Payer: Self-pay

## 2021-08-30 ENCOUNTER — Other Ambulatory Visit (HOSPITAL_BASED_OUTPATIENT_CLINIC_OR_DEPARTMENT_OTHER): Payer: Self-pay

## 2021-09-04 ENCOUNTER — Other Ambulatory Visit (HOSPITAL_BASED_OUTPATIENT_CLINIC_OR_DEPARTMENT_OTHER): Payer: Self-pay

## 2021-09-04 MED ORDER — METFORMIN HCL ER 750 MG PO TB24
ORAL_TABLET | ORAL | 2 refills | Status: AC
  Filled 2021-09-04: qty 60, 30d supply, fill #0

## 2021-09-04 MED ORDER — MONTELUKAST SODIUM 10 MG PO TABS
10.0000 mg | ORAL_TABLET | Freq: Every day | ORAL | 0 refills | Status: AC
  Filled 2021-09-04: qty 30, 30d supply, fill #0

## 2021-09-04 MED ORDER — FIFTY50 GLUCOSE METER 2.0 W/DEVICE KIT: PACK | 0 refills | Status: AC

## 2021-09-04 MED ORDER — NYSTATIN 100000 UNIT/GM EX POWD
CUTANEOUS | 2 refills | Status: DC
Start: 2021-09-04 — End: 2021-11-07
  Filled 2021-09-04: qty 60, 30d supply, fill #0

## 2021-09-07 ENCOUNTER — Other Ambulatory Visit (HOSPITAL_BASED_OUTPATIENT_CLINIC_OR_DEPARTMENT_OTHER): Payer: Self-pay

## 2021-09-07 MED ORDER — ERGOCALCIFEROL 1.25 MG (50000 UT) PO CAPS
50000.0000 [IU] | ORAL_CAPSULE | ORAL | 3 refills | Status: AC
  Filled 2021-09-07: qty 12, 42d supply, fill #0

## 2021-09-11 ENCOUNTER — Other Ambulatory Visit (HOSPITAL_BASED_OUTPATIENT_CLINIC_OR_DEPARTMENT_OTHER): Payer: Self-pay

## 2021-09-14 ENCOUNTER — Other Ambulatory Visit (HOSPITAL_BASED_OUTPATIENT_CLINIC_OR_DEPARTMENT_OTHER): Payer: Self-pay

## 2021-09-14 MED ORDER — ONETOUCH DELICA PLUS LANCET33G MISC
11 refills | Status: AC
  Filled 2021-09-14: qty 100, 31d supply, fill #0

## 2021-09-14 MED ORDER — GLUCOSE BLOOD VI STRP
ORAL_STRIP | 11 refills | Status: AC
  Filled 2021-09-14: qty 50, 31d supply, fill #0

## 2021-09-14 MED ORDER — ONETOUCH VERIO FLEX SYSTEM W/DEVICE KIT
PACK | 0 refills | Status: AC
  Filled 2021-09-14: qty 1, 30d supply, fill #0

## 2021-09-19 ENCOUNTER — Other Ambulatory Visit (HOSPITAL_BASED_OUTPATIENT_CLINIC_OR_DEPARTMENT_OTHER): Payer: Self-pay

## 2021-09-19 MED ORDER — MOUNJARO 2.5 MG/0.5ML ~~LOC~~ SOAJ
SUBCUTANEOUS | 0 refills | Status: DC
Start: 2021-09-18 — End: 2021-10-18
  Filled 2021-09-19 (×2): qty 2, 28d supply, fill #0

## 2021-09-20 ENCOUNTER — Other Ambulatory Visit (HOSPITAL_BASED_OUTPATIENT_CLINIC_OR_DEPARTMENT_OTHER): Payer: Self-pay

## 2021-09-21 ENCOUNTER — Other Ambulatory Visit (HOSPITAL_BASED_OUTPATIENT_CLINIC_OR_DEPARTMENT_OTHER): Payer: Self-pay

## 2021-09-22 ENCOUNTER — Other Ambulatory Visit (HOSPITAL_BASED_OUTPATIENT_CLINIC_OR_DEPARTMENT_OTHER): Payer: Self-pay

## 2021-09-29 ENCOUNTER — Other Ambulatory Visit (HOSPITAL_BASED_OUTPATIENT_CLINIC_OR_DEPARTMENT_OTHER): Payer: Self-pay

## 2021-10-02 ENCOUNTER — Other Ambulatory Visit (HOSPITAL_BASED_OUTPATIENT_CLINIC_OR_DEPARTMENT_OTHER): Payer: Self-pay

## 2021-10-02 MED ORDER — PHENAZOPYRIDINE HCL 200 MG PO TABS
ORAL_TABLET | ORAL | 0 refills | Status: AC
  Filled 2021-10-02: qty 6, 2d supply, fill #0

## 2021-10-02 MED ORDER — FLUCONAZOLE 150 MG PO TABS
ORAL_TABLET | ORAL | 0 refills | Status: AC
Start: 2021-10-02 — End: ?
  Filled 2021-10-02: qty 2, 3d supply, fill #0

## 2021-10-02 MED ORDER — NITROFURANTOIN MONOHYD MACRO 100 MG PO CAPS
ORAL_CAPSULE | ORAL | 0 refills | Status: AC
  Filled 2021-10-02: qty 10, 5d supply, fill #0

## 2021-10-03 ENCOUNTER — Other Ambulatory Visit (HOSPITAL_BASED_OUTPATIENT_CLINIC_OR_DEPARTMENT_OTHER): Payer: Self-pay

## 2021-10-18 ENCOUNTER — Other Ambulatory Visit (HOSPITAL_BASED_OUTPATIENT_CLINIC_OR_DEPARTMENT_OTHER): Payer: Self-pay

## 2021-10-18 MED ORDER — MOUNJARO 2.5 MG/0.5ML ~~LOC~~ SOAJ
SUBCUTANEOUS | 0 refills | Status: DC
  Filled 2021-10-18: qty 2, 28d supply, fill #0

## 2021-11-06 ENCOUNTER — Other Ambulatory Visit (HOSPITAL_BASED_OUTPATIENT_CLINIC_OR_DEPARTMENT_OTHER): Payer: Self-pay

## 2021-11-06 MED ORDER — MOUNJARO 5 MG/0.5ML ~~LOC~~ SOAJ
5.0000 mg | SUBCUTANEOUS | 2 refills | Status: AC
  Filled 2021-11-06: qty 2, 28d supply, fill #0
  Filled 2022-02-09: qty 2, 28d supply, fill #1

## 2022-01-16 ENCOUNTER — Other Ambulatory Visit (HOSPITAL_BASED_OUTPATIENT_CLINIC_OR_DEPARTMENT_OTHER): Payer: Self-pay

## 2022-01-16 MED ORDER — IBUPROFEN 800 MG PO TABS
800.0000 mg | ORAL_TABLET | Freq: Two times a day (BID) | ORAL | 0 refills | Status: AC
  Filled 2022-01-16: qty 30, 15d supply, fill #0

## 2022-01-16 MED ORDER — NAPROXEN 500 MG PO TABS
500.0000 mg | ORAL_TABLET | Freq: Two times a day (BID) | ORAL | 2 refills | Status: AC
  Filled 2022-01-16: qty 60, 30d supply, fill #0

## 2022-02-07 ENCOUNTER — Other Ambulatory Visit (HOSPITAL_BASED_OUTPATIENT_CLINIC_OR_DEPARTMENT_OTHER): Payer: Self-pay

## 2022-02-07 MED ORDER — METFORMIN HCL ER 500 MG PO TB24
500.0000 mg | ORAL_TABLET | Freq: Two times a day (BID) | ORAL | 0 refills | Status: AC
  Filled 2022-02-07: qty 60, 30d supply, fill #0

## 2022-02-07 MED ORDER — VITAMIN D (ERGOCALCIFEROL) 1.25 MG (50000 UNIT) PO CAPS
50000.0000 [IU] | ORAL_CAPSULE | ORAL | 0 refills | Status: AC
  Filled 2022-02-07: qty 4, 28d supply, fill #0
  Filled 2022-07-05: qty 4, 28d supply, fill #1
  Filled 2022-12-15: qty 4, 28d supply, fill #2

## 2022-02-08 ENCOUNTER — Other Ambulatory Visit (HOSPITAL_BASED_OUTPATIENT_CLINIC_OR_DEPARTMENT_OTHER): Payer: Self-pay

## 2022-02-09 ENCOUNTER — Other Ambulatory Visit (HOSPITAL_BASED_OUTPATIENT_CLINIC_OR_DEPARTMENT_OTHER): Payer: Self-pay

## 2022-03-29 ENCOUNTER — Other Ambulatory Visit (HOSPITAL_BASED_OUTPATIENT_CLINIC_OR_DEPARTMENT_OTHER): Payer: Self-pay

## 2022-03-29 MED ORDER — PAXLOVID (300/100) 20 X 150 MG & 10 X 100MG PO TBPK
ORAL_TABLET | ORAL | 0 refills | Status: DC
  Filled 2022-03-29: qty 30, 5d supply, fill #0

## 2022-04-08 ENCOUNTER — Other Ambulatory Visit (HOSPITAL_BASED_OUTPATIENT_CLINIC_OR_DEPARTMENT_OTHER): Payer: Self-pay

## 2022-04-09 ENCOUNTER — Other Ambulatory Visit: Payer: Self-pay

## 2022-04-10 ENCOUNTER — Other Ambulatory Visit (HOSPITAL_BASED_OUTPATIENT_CLINIC_OR_DEPARTMENT_OTHER): Payer: Self-pay

## 2022-04-12 ENCOUNTER — Other Ambulatory Visit (HOSPITAL_BASED_OUTPATIENT_CLINIC_OR_DEPARTMENT_OTHER): Payer: Self-pay

## 2022-04-13 ENCOUNTER — Other Ambulatory Visit (HOSPITAL_BASED_OUTPATIENT_CLINIC_OR_DEPARTMENT_OTHER): Payer: Self-pay

## 2022-04-13 MED ORDER — ALBUTEROL SULFATE HFA 108 (90 BASE) MCG/ACT IN AERS
2.0000 | INHALATION_SPRAY | Freq: Four times a day (QID) | RESPIRATORY_TRACT | 11 refills | Status: AC | PRN
  Filled 2022-04-13: qty 6.7, 25d supply, fill #0

## 2022-04-13 MED ORDER — BUDESONIDE 1 MG/2ML IN SUSP
1.0000 mg | Freq: Two times a day (BID) | RESPIRATORY_TRACT | 1 refills | Status: DC
  Filled 2022-04-13: qty 60, 30d supply, fill #0

## 2022-04-16 ENCOUNTER — Other Ambulatory Visit (HOSPITAL_BASED_OUTPATIENT_CLINIC_OR_DEPARTMENT_OTHER): Payer: Self-pay

## 2022-04-16 MED ORDER — DOXYCYCLINE HYCLATE 100 MG PO TABS
100.0000 mg | ORAL_TABLET | Freq: Two times a day (BID) | ORAL | 0 refills | Status: AC
  Filled 2022-04-16: qty 20, 10d supply, fill #0

## 2022-04-16 MED ORDER — FLUTICASONE PROPIONATE 50 MCG/ACT NA SUSP
1.0000 | Freq: Every day | NASAL | 11 refills | Status: AC
  Filled 2022-04-16: qty 16, 21d supply, fill #0

## 2022-04-18 ENCOUNTER — Other Ambulatory Visit (HOSPITAL_BASED_OUTPATIENT_CLINIC_OR_DEPARTMENT_OTHER): Payer: Self-pay

## 2022-05-22 ENCOUNTER — Other Ambulatory Visit (HOSPITAL_COMMUNITY): Payer: Self-pay

## 2022-05-22 ENCOUNTER — Other Ambulatory Visit (HOSPITAL_BASED_OUTPATIENT_CLINIC_OR_DEPARTMENT_OTHER): Payer: Self-pay

## 2022-05-22 ENCOUNTER — Other Ambulatory Visit (HOSPITAL_BASED_OUTPATIENT_CLINIC_OR_DEPARTMENT_OTHER): Payer: Self-pay | Admitting: Family Medicine

## 2022-05-22 MED ORDER — VALACYCLOVIR HCL 500 MG PO TABS
500.0000 mg | ORAL_TABLET | Freq: Every day | ORAL | 0 refills | Status: AC
  Filled 2022-05-22: qty 30, 30d supply, fill #0

## 2022-05-22 MED ORDER — BUDESONIDE 1 MG/2ML IN SUSP
1.0000 mg | Freq: Two times a day (BID) | RESPIRATORY_TRACT | 0 refills | Status: AC
  Filled 2022-05-22: qty 60, 30d supply, fill #0

## 2022-05-22 MED ORDER — VALACYCLOVIR HCL 1 G PO TABS
2000.0000 mg | ORAL_TABLET | Freq: Two times a day (BID) | ORAL | 0 refills | Status: AC
  Filled 2022-05-22: qty 4, 1d supply, fill #0

## 2022-05-24 ENCOUNTER — Other Ambulatory Visit (HOSPITAL_BASED_OUTPATIENT_CLINIC_OR_DEPARTMENT_OTHER): Payer: Self-pay

## 2022-05-25 ENCOUNTER — Other Ambulatory Visit (HOSPITAL_BASED_OUTPATIENT_CLINIC_OR_DEPARTMENT_OTHER): Payer: Self-pay

## 2022-05-29 ENCOUNTER — Other Ambulatory Visit (HOSPITAL_BASED_OUTPATIENT_CLINIC_OR_DEPARTMENT_OTHER): Payer: Self-pay

## 2022-05-29 MED ORDER — MOUNJARO 10 MG/0.5ML ~~LOC~~ SOAJ
10.0000 mg | SUBCUTANEOUS | 1 refills | Status: DC
  Filled 2022-05-29 – 2022-06-07 (×3): qty 2, 28d supply, fill #0
  Filled 2022-07-05: qty 2, 28d supply, fill #1

## 2022-06-07 ENCOUNTER — Other Ambulatory Visit (HOSPITAL_BASED_OUTPATIENT_CLINIC_OR_DEPARTMENT_OTHER): Payer: Self-pay

## 2022-06-12 ENCOUNTER — Other Ambulatory Visit (HOSPITAL_BASED_OUTPATIENT_CLINIC_OR_DEPARTMENT_OTHER): Payer: Self-pay

## 2022-06-13 ENCOUNTER — Other Ambulatory Visit (HOSPITAL_BASED_OUTPATIENT_CLINIC_OR_DEPARTMENT_OTHER): Payer: Self-pay

## 2022-07-10 ENCOUNTER — Other Ambulatory Visit (HOSPITAL_BASED_OUTPATIENT_CLINIC_OR_DEPARTMENT_OTHER): Payer: Self-pay

## 2022-07-16 ENCOUNTER — Other Ambulatory Visit (HOSPITAL_BASED_OUTPATIENT_CLINIC_OR_DEPARTMENT_OTHER): Payer: Self-pay

## 2022-07-16 MED ORDER — IBUPROFEN 800 MG PO TABS
800.0000 mg | ORAL_TABLET | Freq: Two times a day (BID) | ORAL | 0 refills | Status: AC
  Filled 2022-07-16: qty 60, 30d supply, fill #0

## 2022-07-16 MED ORDER — VITAMIN D (ERGOCALCIFEROL) 1.25 MG (50000 UNIT) PO CAPS
50000.0000 [IU] | ORAL_CAPSULE | ORAL | 1 refills | Status: DC
  Filled 2022-07-16 – 2022-08-07 (×2): qty 4, 28d supply, fill #0
  Filled 2022-08-30: qty 4, 28d supply, fill #1
  Filled 2022-09-25: qty 4, 28d supply, fill #2
  Filled 2022-10-17: qty 4, 28d supply, fill #3
  Filled 2022-11-15: qty 4, 28d supply, fill #4
  Filled 2023-01-12: qty 4, 28d supply, fill #5

## 2022-08-07 ENCOUNTER — Other Ambulatory Visit: Payer: Self-pay

## 2022-08-07 ENCOUNTER — Other Ambulatory Visit (HOSPITAL_BASED_OUTPATIENT_CLINIC_OR_DEPARTMENT_OTHER): Payer: Self-pay

## 2022-08-07 MED ORDER — MOUNJARO 10 MG/0.5ML ~~LOC~~ SOAJ
SUBCUTANEOUS | 1 refills | Status: DC
  Filled 2022-08-07: qty 2, 28d supply, fill #0
  Filled 2022-08-30: qty 2, 28d supply, fill #1

## 2022-09-14 ENCOUNTER — Other Ambulatory Visit (HOSPITAL_BASED_OUTPATIENT_CLINIC_OR_DEPARTMENT_OTHER): Payer: Self-pay

## 2022-09-14 MED ORDER — MOUNJARO 12.5 MG/0.5ML ~~LOC~~ SOAJ
12.5000 mg | SUBCUTANEOUS | 0 refills | Status: DC
  Filled 2022-09-14 – 2022-09-25 (×4): qty 2, 28d supply, fill #0

## 2022-09-18 ENCOUNTER — Other Ambulatory Visit (HOSPITAL_BASED_OUTPATIENT_CLINIC_OR_DEPARTMENT_OTHER): Payer: Self-pay

## 2022-09-25 ENCOUNTER — Other Ambulatory Visit (HOSPITAL_COMMUNITY): Payer: Self-pay

## 2022-09-25 ENCOUNTER — Other Ambulatory Visit (HOSPITAL_BASED_OUTPATIENT_CLINIC_OR_DEPARTMENT_OTHER): Payer: Self-pay

## 2022-10-17 ENCOUNTER — Other Ambulatory Visit (HOSPITAL_BASED_OUTPATIENT_CLINIC_OR_DEPARTMENT_OTHER): Payer: Self-pay

## 2022-10-18 ENCOUNTER — Other Ambulatory Visit: Payer: Self-pay

## 2022-10-19 ENCOUNTER — Other Ambulatory Visit (HOSPITAL_BASED_OUTPATIENT_CLINIC_OR_DEPARTMENT_OTHER): Payer: Self-pay

## 2022-10-19 MED ORDER — MOUNJARO 12.5 MG/0.5ML ~~LOC~~ SOAJ
12.5000 mg | SUBCUTANEOUS | 0 refills | Status: DC
  Filled 2022-10-19: qty 2, 28d supply, fill #0

## 2022-11-15 ENCOUNTER — Other Ambulatory Visit (HOSPITAL_BASED_OUTPATIENT_CLINIC_OR_DEPARTMENT_OTHER): Payer: Self-pay

## 2022-11-15 ENCOUNTER — Other Ambulatory Visit: Payer: Self-pay

## 2022-11-15 MED ORDER — MOUNJARO 12.5 MG/0.5ML ~~LOC~~ SOAJ
12.5000 mg | SUBCUTANEOUS | 2 refills | Status: DC
  Filled 2022-11-15: qty 2, 28d supply, fill #0
  Filled 2022-12-15: qty 2, 28d supply, fill #1
  Filled 2023-01-12: qty 2, 28d supply, fill #2

## 2023-01-29 ENCOUNTER — Other Ambulatory Visit (HOSPITAL_BASED_OUTPATIENT_CLINIC_OR_DEPARTMENT_OTHER): Payer: Self-pay

## 2023-01-29 MED ORDER — DICLOFENAC SODIUM 75 MG PO TBEC
75.0000 mg | DELAYED_RELEASE_TABLET | Freq: Two times a day (BID) | ORAL | 2 refills | Status: AC
  Filled 2023-01-29: qty 60, 30d supply, fill #0
  Filled 2023-04-28: qty 60, 30d supply, fill #1

## 2023-01-29 MED ORDER — METHYLPREDNISOLONE 4 MG PO TBPK
ORAL_TABLET | ORAL | 0 refills | Status: DC
  Filled 2023-01-29: qty 21, 6d supply, fill #0

## 2023-01-30 ENCOUNTER — Other Ambulatory Visit (HOSPITAL_BASED_OUTPATIENT_CLINIC_OR_DEPARTMENT_OTHER): Payer: Self-pay

## 2023-02-05 ENCOUNTER — Other Ambulatory Visit (HOSPITAL_BASED_OUTPATIENT_CLINIC_OR_DEPARTMENT_OTHER): Payer: Self-pay

## 2023-02-05 MED ORDER — VITAMIN D (ERGOCALCIFEROL) 1.25 MG (50000 UNIT) PO CAPS
50000.0000 [IU] | ORAL_CAPSULE | ORAL | 1 refills | Status: DC
  Filled 2023-02-05: qty 4, 28d supply, fill #0
  Filled 2023-02-28 – 2023-03-04 (×2): qty 4, 28d supply, fill #1
  Filled 2023-04-04: qty 4, 28d supply, fill #2
  Filled 2023-04-28: qty 4, 28d supply, fill #3
  Filled 2023-05-31: qty 4, 28d supply, fill #4
  Filled 2023-06-21: qty 4, 28d supply, fill #5

## 2023-02-05 MED ORDER — MOUNJARO 12.5 MG/0.5ML ~~LOC~~ SOAJ
12.5000 mg | SUBCUTANEOUS | 2 refills | Status: AC
  Filled 2023-02-05: qty 2, 28d supply, fill #0
  Filled 2023-02-28: qty 2, 28d supply, fill #1
  Filled 2023-04-28: qty 2, 28d supply, fill #2

## 2023-02-28 ENCOUNTER — Other Ambulatory Visit (HOSPITAL_BASED_OUTPATIENT_CLINIC_OR_DEPARTMENT_OTHER): Payer: Self-pay

## 2023-03-04 ENCOUNTER — Other Ambulatory Visit (HOSPITAL_BASED_OUTPATIENT_CLINIC_OR_DEPARTMENT_OTHER): Payer: Self-pay

## 2023-03-08 ENCOUNTER — Other Ambulatory Visit (HOSPITAL_BASED_OUTPATIENT_CLINIC_OR_DEPARTMENT_OTHER): Payer: Self-pay

## 2023-04-04 ENCOUNTER — Other Ambulatory Visit (HOSPITAL_BASED_OUTPATIENT_CLINIC_OR_DEPARTMENT_OTHER): Payer: Self-pay

## 2023-04-04 MED ORDER — MOUNJARO 15 MG/0.5ML ~~LOC~~ SOAJ
15.0000 mg | SUBCUTANEOUS | 0 refills | Status: DC
  Filled 2023-04-04: qty 2, 28d supply, fill #0
  Filled 2023-04-28: qty 2, 28d supply, fill #1
  Filled 2023-05-31: qty 2, 28d supply, fill #2

## 2023-04-29 ENCOUNTER — Other Ambulatory Visit (HOSPITAL_BASED_OUTPATIENT_CLINIC_OR_DEPARTMENT_OTHER): Payer: Self-pay

## 2023-04-29 ENCOUNTER — Other Ambulatory Visit: Payer: Self-pay

## 2023-05-29 ENCOUNTER — Other Ambulatory Visit (HOSPITAL_BASED_OUTPATIENT_CLINIC_OR_DEPARTMENT_OTHER): Payer: Self-pay

## 2023-05-31 ENCOUNTER — Other Ambulatory Visit: Payer: Self-pay

## 2023-05-31 ENCOUNTER — Other Ambulatory Visit (HOSPITAL_BASED_OUTPATIENT_CLINIC_OR_DEPARTMENT_OTHER): Payer: Self-pay

## 2023-06-19 ENCOUNTER — Other Ambulatory Visit (HOSPITAL_BASED_OUTPATIENT_CLINIC_OR_DEPARTMENT_OTHER): Payer: Self-pay

## 2023-06-19 MED ORDER — MOUNJARO 15 MG/0.5ML ~~LOC~~ SOAJ
15.0000 mg | SUBCUTANEOUS | 0 refills | Status: DC
  Filled 2023-06-19 – 2023-06-21 (×2): qty 2, 28d supply, fill #0
  Filled 2023-07-21: qty 2, 28d supply, fill #1
  Filled 2023-08-21 – 2023-09-03 (×2): qty 2, 28d supply, fill #2

## 2023-06-20 ENCOUNTER — Other Ambulatory Visit (HOSPITAL_BASED_OUTPATIENT_CLINIC_OR_DEPARTMENT_OTHER): Payer: Self-pay

## 2023-06-21 ENCOUNTER — Other Ambulatory Visit: Payer: Self-pay

## 2023-06-21 ENCOUNTER — Other Ambulatory Visit (HOSPITAL_BASED_OUTPATIENT_CLINIC_OR_DEPARTMENT_OTHER): Payer: Self-pay

## 2023-07-09 ENCOUNTER — Other Ambulatory Visit (HOSPITAL_BASED_OUTPATIENT_CLINIC_OR_DEPARTMENT_OTHER): Payer: Self-pay

## 2023-07-19 ENCOUNTER — Other Ambulatory Visit (HOSPITAL_BASED_OUTPATIENT_CLINIC_OR_DEPARTMENT_OTHER): Payer: Self-pay

## 2023-07-21 ENCOUNTER — Other Ambulatory Visit (HOSPITAL_BASED_OUTPATIENT_CLINIC_OR_DEPARTMENT_OTHER): Payer: Self-pay

## 2023-07-23 ENCOUNTER — Other Ambulatory Visit (HOSPITAL_BASED_OUTPATIENT_CLINIC_OR_DEPARTMENT_OTHER): Payer: Self-pay

## 2023-07-23 ENCOUNTER — Other Ambulatory Visit: Payer: Self-pay

## 2023-07-26 ENCOUNTER — Other Ambulatory Visit (HOSPITAL_BASED_OUTPATIENT_CLINIC_OR_DEPARTMENT_OTHER): Payer: Self-pay

## 2023-07-26 MED ORDER — VITAMIN D (ERGOCALCIFEROL) 1.25 MG (50000 UNIT) PO CAPS
50000.0000 [IU] | ORAL_CAPSULE | ORAL | 0 refills | Status: DC
  Filled 2023-07-26: qty 4, 28d supply, fill #0

## 2023-08-07 ENCOUNTER — Other Ambulatory Visit (HOSPITAL_BASED_OUTPATIENT_CLINIC_OR_DEPARTMENT_OTHER): Payer: Self-pay

## 2023-08-07 MED ORDER — VITAMIN D (ERGOCALCIFEROL) 1.25 MG (50000 UNIT) PO CAPS
50000.0000 [IU] | ORAL_CAPSULE | ORAL | 0 refills | Status: DC
  Filled 2023-08-07 – 2023-09-03 (×3): qty 4, 28d supply, fill #0

## 2023-08-21 ENCOUNTER — Other Ambulatory Visit (HOSPITAL_BASED_OUTPATIENT_CLINIC_OR_DEPARTMENT_OTHER): Payer: Self-pay

## 2023-09-03 ENCOUNTER — Other Ambulatory Visit (HOSPITAL_BASED_OUTPATIENT_CLINIC_OR_DEPARTMENT_OTHER): Payer: Self-pay

## 2023-09-11 ENCOUNTER — Other Ambulatory Visit (HOSPITAL_BASED_OUTPATIENT_CLINIC_OR_DEPARTMENT_OTHER): Payer: Self-pay

## 2023-09-11 ENCOUNTER — Other Ambulatory Visit: Payer: Self-pay

## 2023-09-11 MED ORDER — SANTYL 250 UNIT/GM EX OINT
1.0000 | TOPICAL_OINTMENT | Freq: Two times a day (BID) | CUTANEOUS | 3 refills | Status: AC
  Filled 2023-09-11: qty 30, 15d supply, fill #0

## 2023-09-12 ENCOUNTER — Other Ambulatory Visit (HOSPITAL_BASED_OUTPATIENT_CLINIC_OR_DEPARTMENT_OTHER): Payer: Self-pay

## 2023-09-16 ENCOUNTER — Other Ambulatory Visit (HOSPITAL_BASED_OUTPATIENT_CLINIC_OR_DEPARTMENT_OTHER): Payer: Self-pay

## 2023-09-16 MED ORDER — VALACYCLOVIR HCL 500 MG PO TABS
500.0000 mg | ORAL_TABLET | Freq: Every day | ORAL | 1 refills | Status: AC
  Filled 2023-09-16: qty 30, 30d supply, fill #0
  Filled 2023-11-24: qty 30, 30d supply, fill #1

## 2023-10-04 ENCOUNTER — Other Ambulatory Visit (HOSPITAL_BASED_OUTPATIENT_CLINIC_OR_DEPARTMENT_OTHER): Payer: Self-pay

## 2023-10-04 MED ORDER — VITAMIN D (ERGOCALCIFEROL) 1.25 MG (50000 UNIT) PO CAPS
50000.0000 [IU] | ORAL_CAPSULE | ORAL | 2 refills | Status: DC
  Filled 2023-10-04: qty 4, 28d supply, fill #0
  Filled 2023-10-27: qty 4, 28d supply, fill #1
  Filled 2023-11-24: qty 4, 28d supply, fill #2

## 2023-10-04 MED ORDER — MOUNJARO 15 MG/0.5ML ~~LOC~~ SOAJ
15.0000 mg | SUBCUTANEOUS | 0 refills | Status: DC
  Filled 2023-10-04: qty 2, 28d supply, fill #0
  Filled 2023-10-27: qty 2, 28d supply, fill #1
  Filled 2023-11-24: qty 2, 28d supply, fill #2

## 2023-11-14 ENCOUNTER — Other Ambulatory Visit (HOSPITAL_BASED_OUTPATIENT_CLINIC_OR_DEPARTMENT_OTHER): Payer: Self-pay

## 2023-11-27 ENCOUNTER — Other Ambulatory Visit (HOSPITAL_BASED_OUTPATIENT_CLINIC_OR_DEPARTMENT_OTHER): Payer: Self-pay

## 2023-12-22 ENCOUNTER — Other Ambulatory Visit (HOSPITAL_BASED_OUTPATIENT_CLINIC_OR_DEPARTMENT_OTHER): Payer: Self-pay

## 2023-12-23 ENCOUNTER — Other Ambulatory Visit (HOSPITAL_BASED_OUTPATIENT_CLINIC_OR_DEPARTMENT_OTHER): Payer: Self-pay

## 2023-12-23 MED ORDER — MOUNJARO 15 MG/0.5ML ~~LOC~~ SOAJ
15.0000 mg | SUBCUTANEOUS | 0 refills | Status: DC
  Filled 2023-12-23: qty 2, 28d supply, fill #0
  Filled 2024-01-16: qty 2, 28d supply, fill #1
  Filled 2024-02-13: qty 2, 28d supply, fill #2

## 2023-12-23 MED ORDER — VITAMIN D (ERGOCALCIFEROL) 1.25 MG (50000 UNIT) PO CAPS
50000.0000 [IU] | ORAL_CAPSULE | ORAL | 0 refills | Status: DC
  Filled 2023-12-23: qty 4, 28d supply, fill #0

## 2024-01-16 ENCOUNTER — Other Ambulatory Visit: Payer: Self-pay

## 2024-01-16 ENCOUNTER — Other Ambulatory Visit (HOSPITAL_BASED_OUTPATIENT_CLINIC_OR_DEPARTMENT_OTHER): Payer: Self-pay

## 2024-01-16 MED ORDER — VITAMIN D (ERGOCALCIFEROL) 1.25 MG (50000 UNIT) PO CAPS
50000.0000 [IU] | ORAL_CAPSULE | ORAL | 0 refills | Status: DC
  Filled 2024-01-16: qty 4, 28d supply, fill #0

## 2024-02-13 ENCOUNTER — Other Ambulatory Visit (HOSPITAL_BASED_OUTPATIENT_CLINIC_OR_DEPARTMENT_OTHER): Payer: Self-pay

## 2024-02-13 ENCOUNTER — Other Ambulatory Visit: Payer: Self-pay

## 2024-02-13 MED ORDER — VITAMIN D (ERGOCALCIFEROL) 1.25 MG (50000 UNIT) PO CAPS
50000.0000 [IU] | ORAL_CAPSULE | ORAL | 0 refills | Status: DC
  Filled 2024-02-13: qty 4, 28d supply, fill #0

## 2024-03-31 ENCOUNTER — Other Ambulatory Visit (HOSPITAL_BASED_OUTPATIENT_CLINIC_OR_DEPARTMENT_OTHER): Payer: Self-pay

## 2024-03-31 MED ORDER — MOUNJARO 15 MG/0.5ML ~~LOC~~ SOAJ
0.5000 mL | SUBCUTANEOUS | 2 refills | Status: AC
  Filled 2024-03-31: qty 2, 28d supply, fill #0

## 2024-03-31 MED ORDER — VITAMIN D (ERGOCALCIFEROL) 1.25 MG (50000 UNIT) PO CAPS
ORAL_CAPSULE | ORAL | 1 refills | Status: AC
  Filled 2024-03-31: qty 4, 28d supply, fill #0

## 2024-04-03 ENCOUNTER — Other Ambulatory Visit (HOSPITAL_BASED_OUTPATIENT_CLINIC_OR_DEPARTMENT_OTHER): Payer: Self-pay

## 2024-04-03 MED ORDER — NAPROXEN 500 MG PO TABS
500.0000 mg | ORAL_TABLET | Freq: Two times a day (BID) | ORAL | 0 refills | Status: AC | PRN
  Filled 2024-04-03: qty 60, 30d supply, fill #0
